# Patient Record
Sex: Female | Born: 1980 | Hispanic: Yes | Marital: Single | Smoking: Never smoker
Health system: Southern US, Community
[De-identification: ages and names within clinical notes are randomized; demographics above are authoritative.]

## PROBLEM LIST (undated history)

## (undated) DIAGNOSIS — T7840XA Allergy, unspecified, initial encounter: Secondary | ICD-10-CM

---

## 2011-07-10 ENCOUNTER — Ambulatory Visit: Payer: Self-pay | Admitting: Family Medicine

## 2011-12-07 ENCOUNTER — Inpatient Hospital Stay: Payer: Self-pay

## 2011-12-07 LAB — CBC WITH DIFFERENTIAL/PLATELET
Basophil #: 0 10*3/uL (ref 0.0–0.1)
Basophil %: 0.2 %
Eosinophil %: 0.3 %
HGB: 12.9 g/dL (ref 12.0–16.0)
Lymphocyte %: 20.8 %
MCH: 32.3 pg (ref 26.0–34.0)
MCHC: 34.5 g/dL (ref 32.0–36.0)
MCV: 94 fL (ref 80–100)
Monocyte #: 0.6 10*3/uL (ref 0.0–0.7)
Monocyte %: 5.5 %
Neutrophil %: 73.2 %
RBC: 4.01 10*6/uL (ref 3.80–5.20)
RDW: 13.4 % (ref 11.5–14.5)

## 2011-12-08 LAB — HEMATOCRIT: HCT: 37.5 % (ref 35.0–47.0)

## 2015-03-29 NOTE — H&P (Signed)
L&D Evaluation:  History:   HPI 34 yo G3P2002 with LMP of 02/20/11 & [facility] 11/27/[facility] for allergic rhinitis, fibrocystic breast disease, US done with EDD of 12/02/11. SROM at 1600 clear fluid. Pt is hurting now with UC's. No VB, decreased FM,    Presents with leaking fluid    Patient's Medical History fibrocystic breast disease, hemorrhoids, HA, vaginitis    Patient's Surgical History none    Medications Pre Natal Vitamins    Allergies NKDA    Social History none    Family History Non-Contributory   ROS:   ROS All systems were reviewed.  HEENT, CNS, GI, GU, Respiratory, CV, Renal and Musculoskeletal systems were found to be normal.   Exam:   Vital Signs stable    General no apparent distress    Mental Status clear    Chest clear    Heart normal sinus rhythm, no murmur/gallop/rubs    Abdomen gravid, non-tender    Estimated Fetal Weight Average for gestational age    Back no CVAT    Edema 1+    Reflexes 1+    Pelvic 6/100/vtx    Description clear    FHT normal rate with no decels    Ucx regular    Skin dry    Lymph no lymphadenopathy   Impression:   Impression active labor   Plan:   Plan monitor contractions and for cervical change, Admit for del   Electronic Signatures: Sharee PimpleJones, Jazziel Fitzsimmons W (CNM)  (Signed 18-Jan-13 18:01)  Authored: L&D Evaluation   Last Updated: 18-Jan-13 18:01 by Sharee PimpleJones, Eva Griffo W (CNM)

## 2016-03-20 ENCOUNTER — Other Ambulatory Visit: Payer: Self-pay | Admitting: Physician Assistant

## 2016-03-20 DIAGNOSIS — Z30431 Encounter for routine checking of intrauterine contraceptive device: Secondary | ICD-10-CM

## 2016-03-22 ENCOUNTER — Ambulatory Visit
Admission: RE | Admit: 2016-03-22 | Discharge: 2016-03-22 | Disposition: A | Discharge status: Home / Self Care | Payer: BLUE CROSS/BLUE SHIELD | Source: Ambulatory Visit | Attending: Physician Assistant | Admitting: Physician Assistant

## 2016-03-22 DIAGNOSIS — Z30431 Encounter for routine checking of intrauterine contraceptive device: Secondary | ICD-10-CM | POA: Insufficient documentation

## 2016-03-22 DIAGNOSIS — Z975 Presence of (intrauterine) contraceptive device: Secondary | ICD-10-CM | POA: Insufficient documentation

## 2016-09-28 ENCOUNTER — Ambulatory Visit
Admission: EM | Admit: 2016-09-28 | Discharge: 2016-09-28 | Disposition: A | Discharge status: Home / Self Care | Payer: BLUE CROSS/BLUE SHIELD | Attending: Family Medicine | Admitting: Family Medicine

## 2016-09-28 ENCOUNTER — Encounter: Payer: Self-pay | Admitting: Gynecology

## 2016-09-28 DIAGNOSIS — H6501 Acute serous otitis media, right ear: Secondary | ICD-10-CM | POA: Diagnosis not present

## 2016-09-28 HISTORY — DX: Allergy, unspecified, initial encounter: T78.40XA

## 2016-09-28 LAB — RAPID STREP SCREEN (MED CTR MEBANE ONLY): STREPTOCOCCUS, GROUP A SCREEN (DIRECT): NEGATIVE

## 2016-09-28 MED ORDER — AMOXICILLIN 875 MG PO TABS: 875.0000 mg | ORAL_TABLET | Freq: Two times a day (BID) | ORAL | 0 refills | Status: DC

## 2016-09-28 NOTE — ED Triage Notes (Signed)
Patient c/o upper respiratory infection and sore throat x 3 days.

## 2016-09-28 NOTE — ED Provider Notes (Signed)
[facility]    CSN: [id number] Arrival date & time: 09/28/16  1505     History   Chief Complaint Chief Complaint  Patient presents with  . URI  . Sore Throat    HPI Monica Travis is a 35 y.o. female.   The history is provided by the patient.  URI  Presenting symptoms: congestion, cough, ear pain (right) and sore throat   Presenting symptoms: no facial pain   Severity:  Moderate Onset quality:  Sudden Duration:  4 days Timing:  Constant Progression:  Worsening Chronicity:  New Relieved by:  Nothing Ineffective treatments:  OTC medications Associated symptoms: no arthralgias, no headaches, no myalgias, no neck pain, no sinus pain, no sneezing, no swollen glands and no wheezing   Risk factors: not elderly, no chronic cardiac disease, no chronic kidney disease, no chronic respiratory disease, no diabetes mellitus, no immunosuppression, no recent illness, no recent travel and no sick contacts     Past Medical History:  Diagnosis Date  . Allergy     There are no active problems to display for this patient.   History reviewed. No pertinent surgical history.  OB History    Gravida Para Term Preterm AB Living   1             SAB TAB Ectopic Multiple Live Births                   Home Medications    Prior to Admission medications   Medication Sig Start Date End Date Taking? Authorizing Provider  cetirizine (ZYRTEC) 10 MG tablet Take 10 mg by mouth daily.   Yes Historical Provider, MD  amoxicillin (AMOXIL) 875 MG tablet Take 1 tablet (875 mg total) by mouth 2 (two) times daily. 09/28/16   Payton Mccallumrlando Morris Longenecker, MD    Family History No family history on file.  Social History Social History  Substance Use Topics  . Smoking status: Never Smoker  . Smokeless tobacco: Never Used  . Alcohol use No     Allergies   Patient has no allergy information on record.   Review of Systems Review of Systems  HENT: Positive for congestion, ear pain  (right) and sore throat. Negative for sinus pain and sneezing.   Respiratory: Positive for cough. Negative for wheezing.   Musculoskeletal: Negative for arthralgias, myalgias and neck pain.  Neurological: Negative for headaches.     Physical Exam Triage Vital Signs ED Triage Vitals  Enc Vitals Group     BP 09/28/16 1641 125/80     Pulse Rate 09/28/16 1641 80     Resp 09/28/16 1641 16     Temp 09/28/16 1641 98.3 F (36.8 C)     Temp Source 09/28/16 1641 Oral     SpO2 09/28/16 1641 100 %     Weight 09/28/16 1643 146 lb (66.2 kg)     Height 09/28/16 1643 5' (1.524 m)     Head Circumference --      Peak Flow --      Pain Score 09/28/16 1646 3     Pain Loc --      Pain Edu? --      Excl. in GC? --    No data found.   Updated Vital Signs BP 125/80 (BP Location: Left Arm)   Pulse 80   Temp 98.3 F (36.8 C) (Oral)   Resp 16   Ht 5' (1.524 m)   Wt 146 lb (66.2 kg)  LMP 09/14/2016   SpO2 100%   BMI 28.51 kg/m   Visual Acuity Right Eye Distance:   Left Eye Distance:   Bilateral Distance:    Right Eye Near:   Left Eye Near:    Bilateral Near:     Physical Exam  Constitutional: She appears well-developed and well-nourished. No distress.  HENT:  Head: Normocephalic and atraumatic.  Right Ear: External ear and ear canal normal. Tympanic membrane is erythematous and bulging. A middle ear effusion is present.  Left Ear: Tympanic membrane, external ear and ear canal normal.  Nose: Mucosal edema and rhinorrhea present. No nose lacerations, sinus tenderness, nasal deformity, septal deviation or nasal septal hematoma. No epistaxis.  No foreign bodies. Right sinus exhibits no maxillary sinus tenderness and no frontal sinus tenderness. Left sinus exhibits no maxillary sinus tenderness and no frontal sinus tenderness.  Mouth/Throat: Uvula is midline, oropharynx is clear and moist and mucous membranes are normal. No oropharyngeal exudate.  Eyes: Conjunctivae and EOM are normal.  Pupils are equal, round, and reactive to light. Right eye exhibits no discharge. Left eye exhibits no discharge. No scleral icterus.  Neck: Normal range of motion. Neck supple. No thyromegaly present.  Cardiovascular: Normal rate, regular rhythm and normal heart sounds.   Pulmonary/Chest: Effort normal and breath sounds normal. No respiratory distress. She has no wheezes. She has no rales.  Lymphadenopathy:    She has no cervical adenopathy.  Skin: She is not diaphoretic.  Nursing note and vitals reviewed.    UC Treatments / Results  Labs (all labs ordered are listed, but only abnormal results are displayed) Labs Reviewed  RAPID STREP SCREEN (NOT AT [facility])  CULTURE, GROUP A STREP [facility])    EKG  EKG Interpretation None       Radiology No results found.  Procedures Procedures (including critical care time)  Medications Ordered in UC Medications - No data to display   Initial Impression / Assessment and Plan / UC Course  I have reviewed the triage vital signs and the nursing notes.  Pertinent labs & imaging results that were available during my care of the patient were reviewed by me and considered in my medical decision making (see chart for details).  Clinical Course       Final Clinical Impressions(s) / UC Diagnoses   Final diagnoses:  Right acute serous otitis media, recurrence not specified    New Prescriptions Discharge Medication List as of 09/28/2016  5:20 PM    START taking these medications   Details  amoxicillin (AMOXIL) 875 MG tablet Take 1 tablet (875 mg total) by mouth 2 (two) times daily., Starting Fri 09/28/2016, Normal       1. diagnosis reviewed with patient 2. rx as per orders above; reviewed possible side effects, interactions, risks and benefits  3. Recommend supportive treatment with increased fluids 4. Follow-up prn if symptoms worsen or don't improve   Payton Mccallumrlando Bernell Haynie, MD 09/28/16 1814

## 2016-10-01 ENCOUNTER — Telehealth: Payer: Self-pay | Admitting: *Deleted

## 2016-10-01 LAB — CULTURE, GROUP A STREP (THRC)

## 2017-01-08 ENCOUNTER — Other Ambulatory Visit: Payer: Self-pay | Admitting: Physician Assistant

## 2017-01-08 DIAGNOSIS — Z3201 Encounter for pregnancy test, result positive: Secondary | ICD-10-CM

## 2017-01-11 ENCOUNTER — Emergency Department: Payer: BLUE CROSS/BLUE SHIELD

## 2017-01-11 ENCOUNTER — Emergency Department
Admission: EM | Admit: 2017-01-11 | Discharge: 2017-01-11 | Disposition: A | Discharge status: Home / Self Care | Payer: BLUE CROSS/BLUE SHIELD | Attending: Emergency Medicine | Admitting: Emergency Medicine

## 2017-01-11 DIAGNOSIS — Z3A01 Less than 8 weeks gestation of pregnancy: Secondary | ICD-10-CM | POA: Insufficient documentation

## 2017-01-11 DIAGNOSIS — O209 Hemorrhage in early pregnancy, unspecified: Secondary | ICD-10-CM | POA: Insufficient documentation

## 2017-01-11 DIAGNOSIS — O4690 Antepartum hemorrhage, unspecified, unspecified trimester: Secondary | ICD-10-CM

## 2017-01-11 DIAGNOSIS — N898 Other specified noninflammatory disorders of vagina: Secondary | ICD-10-CM | POA: Diagnosis not present

## 2017-01-11 LAB — CBC WITH DIFFERENTIAL/PLATELET
BASOS ABS: 0.1 10*3/uL (ref 0–0.1)
BASOS PCT: 1 %
Eosinophils Absolute: 0.1 10*3/uL (ref 0–0.7)
Eosinophils Relative: 2 %
HCT: 37.4 % (ref 35.0–47.0)
HEMOGLOBIN: 13.5 g/dL (ref 12.0–16.0)
Lymphocytes Relative: 32 %
Lymphs Abs: 2.4 10*3/uL (ref 1.0–3.6)
MCH: 31.9 pg (ref 26.0–34.0)
MCHC: 36 g/dL (ref 32.0–36.0)
MCV: 88.7 fL (ref 80.0–100.0)
Monocytes Absolute: 0.4 10*3/uL (ref 0.2–0.9)
Monocytes Relative: 6 %
NEUTROS ABS: 4.4 10*3/uL (ref 1.4–6.5)
NEUTROS PCT: 59 %
Platelets: 283 10*3/uL (ref 150–440)
RBC: 4.22 MIL/uL (ref 3.80–5.20)
RDW: 12.8 % (ref 11.5–14.5)
WBC: 7.4 10*3/uL (ref 3.6–11.0)

## 2017-01-11 LAB — POCT PREGNANCY, URINE: PREG TEST UR: NEGATIVE

## 2017-01-11 LAB — ABO/RH: ABO/RH(D): A POS

## 2017-01-11 LAB — HCG, QUANTITATIVE, PREGNANCY: HCG, BETA CHAIN, QUANT, S: 77 m[IU]/mL — AB (ref <5)

## 2017-01-11 NOTE — ED Triage Notes (Signed)
Pt states that she is [redacted] weeks pregnant, pt states that she started cramping yesterday then bleeding a little last night, pt states that at work this am she started bleeding heavier like a menstral period.

## 2017-01-11 NOTE — ED Provider Notes (Signed)
Time Seen: Approximately 0839  I have reviewed the triage notes  Chief Complaint: Vaginal Bleeding   History of Present Illness: Monica Travis is a 36 y.o. female *who presents with some previous history of gravida 4 para 3 with 3 previous vaginal deliveries. She states that she is approximately [redacted] weeks pregnant and had a positive pregnancy test and was seen and evaluated briefly in the office by the OB/GYN. No ultrasound was performed, etc. Patient states she's been doing well up until last night she noticed some spotty vaginal bleeding that seemed to resolve and then she went to work today and started having heavier vaginal bleeding. She states she has not noticed any large clots. She is used to 4 pads prior to arrival. She denies any significant pain at this time.   Past Medical History:  Diagnosis Date  . Allergy     There are no active problems to display for this patient.   No past surgical history on file.  No past surgical history on file.  Current Outpatient Rx  . Order #: [id number] Class: Normal  . Order #: [id number] Class: Historical Med    Allergies:  Patient has no known allergies.  Family History: No family history on file.  Social History: Social History  Substance Use Topics  . Smoking status: Never Smoker  . Smokeless tobacco: Never Used  . Alcohol use No     Review of Systems:   10 point review of systems was performed and was otherwise negative:  Constitutional: No fever Eyes: No visual disturbances ENT: No sore throat, ear pain Cardiac: No chest pain Respiratory: No shortness of breath, wheezing, or stridor Abdomen: Abdominal pain was lower middle with cramping Endocrine: No weight loss, No night sweats Extremities: No peripheral edema, cyanosis Skin: No rashes, easy bruising Neurologic: No focal weakness, trouble with speech or swollowing Urologic: No dysuria, Hematuria, or urinary frequency   Physical Exam:  ED Triage Vitals   Enc Vitals Group     BP 01/11/17 0827 119/77     Pulse Rate 01/11/17 0827 76     Resp 01/11/17 0827 15     Temp 01/11/17 0827 98.9 F (37.2 C)     Temp Source 01/11/17 0827 Oral     SpO2 01/11/17 0827 98 %     Weight 01/11/17 0828 154 lb (69.9 kg)     Height 01/11/17 0828 5' (1.524 m)     Head Circumference --      Peak Flow --      Pain Score 01/11/17 0833 1     Pain Loc --      Pain Edu? --      Excl. in GC? --     General: Awake , Alert , and Oriented times 3; GCS 15 Head: Normal cephalic , atraumatic Eyes: Pupils equal , round, reactive to light Nose/Throat: No nasal drainage, patent upper airway without erythema or exudate.  Neck: Supple, Full range of motion, No anterior adenopathy or palpable thyroid masses Lungs: Clear to ascultation without wheezes , rhonchi, or rales Heart: Regular rate, regular rhythm without murmurs , gallops , or rubs Abdomen: Soft, non tender without rebound, guarding , or rigidity; bowel sounds positive and symmetric in all 4 quadrants. No organomegaly .        Extremities: 2 plus symmetric pulses. No edema, clubbing or cyanosis Neurologic: normal ambulation, Motor symmetric without deficits, sensory intact Skin: warm, dry, no rashes   Labs:   All laboratory work was  reviewed including any pertinent negatives or positives listed below:  Labs Reviewed  HCG, QUANTITATIVE, PREGNANCY  CBC WITH DIFFERENTIAL/PLATELET  POC URINE PREG, ED  POC URINE PREG, ED  ABO/RH  Blood type is A+ CBC is stable Quantitative pregnancy is low at 77  Radiology: * "[place] Ob Comp Less 14 Wks  Result Date: 01/11/2017 CLINICAL DATA:  36 year old female with vaginal bleeding in the first trimester of pregnancy. Initial encounter. Quantitative beta HCG 77. EXAM: OBSTETRIC <14 WK ULTRASOUND TECHNIQUE: Transabdominal ultrasound was performed for evaluation of the gestation as well as the maternal uterus and adnexal regions. COMPARISON:  Pelvis ultrasound 03/22/2016.  FINDINGS: Intrauterine gestational sac: Questionable (images 3453 and 60). Yolk sac:  None Embryo:  None Cardiac Activity: Not applicable Heart Rate: Not applicable bpm Subchorionic hemorrhage:  None Maternal uterus/adnexae: The uterus is retroflexed. Cystic area at the uterine fundus on image 53 measuring 4-5 mm diameter. But this seems to be outside of the endometrium and within the myometrium on image 60. Otherwise bland appearance of the endometrium measuring 4 mm (image 38). Normal right ovary measuring 1.2 x 2.3 x 1.4 cm. The left ovary measures 1.8 x 3.3 x 1.2 cm and contains a dominant 12 mm simple appearing cyst or follicle (image 88). There is also a more complex 12 mm hypoechoic area in the left ovary (images 91-94) but there is no surrounding hypervascularity. No pelvic free fluid. IMPRESSION: 1. Questionable early intrauterine gestational sac, but more likely a small myometrial cyst. No yolk sac, fetal pole, or cardiac activity . 2. Normal right ovary. 12 mm complex area in the left ovary but no associated hypervascularity. No pelvic free fluid. 3. Differential considerations include Ectopic Pregnancy, early IUP, and failed IUP. Recommend serial quantitative B-HCG levels and follow-up US as needed to assess viability. Electronically Signed   By: Odessa FlemingH  Hall M.D.   On: 01/11/2017 11:14   [place] Ob Transvaginal  Result Date: 01/11/2017 CLINICAL DATA:  36 year old female with vaginal bleeding in the first trimester of pregnancy. Initial encounter. Quantitative beta HCG 77. EXAM: OBSTETRIC <14 WK ULTRASOUND TECHNIQUE: Transabdominal ultrasound was performed for evaluation of the gestation as well as the maternal uterus and adnexal regions. COMPARISON:  Pelvis ultrasound 03/22/2016. FINDINGS: Intrauterine gestational sac: Questionable (images 5453 and 60). Yolk sac:  None Embryo:  None Cardiac Activity: Not applicable Heart Rate: Not applicable bpm Subchorionic hemorrhage:  None Maternal uterus/adnexae: The uterus  is retroflexed. Cystic area at the uterine fundus on image 53 measuring 4-5 mm diameter. But this seems to be outside of the endometrium and within the myometrium on image 60. Otherwise bland appearance of the endometrium measuring 4 mm (image 38). Normal right ovary measuring 1.2 x 2.3 x 1.4 cm. The left ovary measures 1.8 x 3.3 x 1.2 cm and contains a dominant 12 mm simple appearing cyst or follicle (image 88). There is also a more complex 12 mm hypoechoic area in the left ovary (images 91-94) but there is no surrounding hypervascularity. No pelvic free fluid. IMPRESSION: 1. Questionable early intrauterine gestational sac, but more likely a small myometrial cyst. No yolk sac, fetal pole, or cardiac activity . 2. Normal right ovary. 12 mm complex area in the left ovary but no associated hypervascularity. No pelvic free fluid. 3. Differential considerations include Ectopic Pregnancy, early IUP, and failed IUP. Recommend serial quantitative B-HCG levels and follow-up US as needed to assess viability. Electronically Signed   By: Odessa FlemingH  Hall M.D.   On: 01/11/2017 11:14  "  I personally reviewed the radiologic studies    ED Course:  The patient's ultrasound was equivocal at this time. Given her clinical presentation is most likely is a spontaneous abortion., Patient will require follow-up to see which direction her serum quantitative pregnancy test goes; he arises or quadruples in the next 4 days  or decreases to a 0.   Assessment:  First trimester vaginal bleeding   F   Plan: * I spoke to Dr.Staebler who is on-call for OB/GYN unassigned and we have aligned a repeat quantitative pregnancy test to be performed on Tuesday. Was advised to return here to the emergency department she develops heavy vaginal bleeding of 1 full pad per hour for 4 consecutive hours, increasing abdominal pain, fever, or any other new concerns.  Patient was advised to return immediately if condition worsens. Patient was advised  to follow up with their primary care physician or other specialized physicians involved in their outpatient care. The patient and/or family member/power of attorney had laboratory results reviewed at the bedside. All questions and concerns were addressed and appropriate discharge instructions were distributed by the nursing staff.             Jennye Moccasin, MD 01/11/17 [phone]

## 2017-01-11 NOTE — Discharge Instructions (Signed)
Please call the doctor's office either later today or Monday to schedule an appointment to have your blood redrawn for a serum quantitative pregnancy. Please let the office staff know that this is an emergency department follow-up and we spoke to Dr. Bonney AidStaebler who knows about your visit to the emergency department.  Please drink plenty of fluids and return to the emergency department if you have heavy vaginal bleeding of more than 1 full pad per hour for 4 consecutive hours, increasing abdominal pain, or any other new concerns.

## 2017-01-21 ENCOUNTER — Ambulatory Visit: Admission: RE | Admit: 2017-01-21 | Payer: BLUE CROSS/BLUE SHIELD | Source: Ambulatory Visit

## 2017-05-15 LAB — OB RESULTS CONSOLE GC/CHLAMYDIA
Chlamydia: NEGATIVE
Gonorrhea: NEGATIVE

## 2017-05-15 LAB — OB RESULTS CONSOLE HIV ANTIBODY (ROUTINE TESTING): HIV: NONREACTIVE

## 2017-05-15 LAB — OB RESULTS CONSOLE RPR: RPR: NONREACTIVE

## 2017-05-15 LAB — OB RESULTS CONSOLE HEPATITIS B SURFACE ANTIGEN: HEP B S AG: NEGATIVE

## 2017-05-15 LAB — OB RESULTS CONSOLE VARICELLA ZOSTER ANTIBODY, IGG: Varicella: IMMUNE

## 2017-06-20 ENCOUNTER — Other Ambulatory Visit: Payer: Self-pay | Admitting: Primary Care

## 2017-06-20 DIAGNOSIS — Z3481 Encounter for supervision of other normal pregnancy, first trimester: Secondary | ICD-10-CM

## 2017-06-25 ENCOUNTER — Ambulatory Visit
Admission: RE | Admit: 2017-06-25 | Discharge: 2017-06-25 | Disposition: A | Discharge status: Home / Self Care | Payer: BLUE CROSS/BLUE SHIELD | Source: Ambulatory Visit | Attending: Primary Care | Admitting: Primary Care

## 2017-06-25 DIAGNOSIS — Z3A17 17 weeks gestation of pregnancy: Secondary | ICD-10-CM | POA: Insufficient documentation

## 2017-06-25 DIAGNOSIS — Z3689 Encounter for other specified antenatal screening: Secondary | ICD-10-CM | POA: Insufficient documentation

## 2017-06-25 DIAGNOSIS — Z3481 Encounter for supervision of other normal pregnancy, first trimester: Secondary | ICD-10-CM

## 2017-06-25 DIAGNOSIS — O09522 Supervision of elderly multigravida, second trimester: Secondary | ICD-10-CM | POA: Insufficient documentation

## 2017-10-25 LAB — OB RESULTS CONSOLE GBS: GBS: POSITIVE

## 2017-11-21 ENCOUNTER — Other Ambulatory Visit: Payer: Self-pay | Admitting: Primary Care

## 2017-11-21 ENCOUNTER — Other Ambulatory Visit: Payer: Self-pay | Admitting: Obstetrics and Gynecology

## 2017-11-21 DIAGNOSIS — Z3483 Encounter for supervision of other normal pregnancy, third trimester: Secondary | ICD-10-CM

## 2017-11-21 DIAGNOSIS — O24419 Gestational diabetes mellitus in pregnancy, unspecified control: Secondary | ICD-10-CM

## 2017-11-22 ENCOUNTER — Inpatient Hospital Stay: Payer: BLUE CROSS/BLUE SHIELD | Admitting: Anesthesiology

## 2017-11-22 ENCOUNTER — Other Ambulatory Visit: Payer: Self-pay

## 2017-11-22 ENCOUNTER — Inpatient Hospital Stay
Admission: EM | Admit: 2017-11-22 | Discharge: 2017-11-25 | DRG: 787 | Disposition: A | Discharge status: Home / Self Care | Payer: BLUE CROSS/BLUE SHIELD | Attending: Obstetrics and Gynecology | Admitting: Obstetrics and Gynecology

## 2017-11-22 ENCOUNTER — Encounter
Admission: EM | Disposition: A | Discharge status: Home / Self Care | Payer: Self-pay | Source: Home / Self Care | Attending: Obstetrics and Gynecology

## 2017-11-22 ENCOUNTER — Inpatient Hospital Stay: Payer: BLUE CROSS/BLUE SHIELD

## 2017-11-22 DIAGNOSIS — D62 Acute posthemorrhagic anemia: Secondary | ICD-10-CM | POA: Diagnosis not present

## 2017-11-22 DIAGNOSIS — O1414 Severe pre-eclampsia complicating childbirth: Secondary | ICD-10-CM | POA: Diagnosis present

## 2017-11-22 DIAGNOSIS — O9081 Anemia of the puerperium: Secondary | ICD-10-CM | POA: Diagnosis not present

## 2017-11-22 DIAGNOSIS — Z3A39 39 weeks gestation of pregnancy: Secondary | ICD-10-CM

## 2017-11-22 DIAGNOSIS — O99824 Streptococcus B carrier state complicating childbirth: Secondary | ICD-10-CM | POA: Diagnosis present

## 2017-11-22 DIAGNOSIS — O24425 Gestational diabetes mellitus in childbirth, controlled by oral hypoglycemic drugs: Principal | ICD-10-CM | POA: Diagnosis present

## 2017-11-22 DIAGNOSIS — Z419 Encounter for procedure for purposes other than remedying health state, unspecified: Secondary | ICD-10-CM

## 2017-11-22 DIAGNOSIS — O24419 Gestational diabetes mellitus in pregnancy, unspecified control: Secondary | ICD-10-CM

## 2017-11-22 LAB — GLUCOSE, CAPILLARY
GLUCOSE-CAPILLARY: 107 mg/dL — AB (ref 65–99)
Glucose-Capillary: 116 mg/dL — ABNORMAL HIGH (ref 65–99)
Glucose-Capillary: 129 mg/dL — ABNORMAL HIGH (ref 65–99)
Glucose-Capillary: 102 mg/dL — ABNORMAL HIGH (ref 65–99)

## 2017-11-22 LAB — COMPREHENSIVE METABOLIC PANEL
ALK PHOS: 129 U/L — AB (ref 38–126)
ALK PHOS: 137 U/L — AB (ref 38–126)
ALT: 16 U/L (ref 14–54)
ALT: 15 U/L (ref 14–54)
AST: 22 U/L (ref 15–41)
AST: 26 U/L (ref 15–41)
Albumin: 3.1 g/dL — ABNORMAL LOW (ref 3.5–5.0)
Albumin: 3.1 g/dL — ABNORMAL LOW (ref 3.5–5.0)
Anion gap: 10 (ref 5–15)
Anion gap: 11 (ref 5–15)
BILIRUBIN TOTAL: 0.4 mg/dL (ref 0.3–1.2)
BUN: 8 mg/dL (ref 6–20)
BUN: 6 mg/dL (ref 6–20)
CALCIUM: 9.1 mg/dL (ref 8.9–10.3)
CALCIUM: 8.8 mg/dL — AB (ref 8.9–10.3)
CHLORIDE: 106 mmol/L (ref 101–111)
CO2: 21 mmol/L — AB (ref 22–32)
CO2: 21 mmol/L — ABNORMAL LOW (ref 22–32)
CREATININE: 0.43 mg/dL — AB (ref 0.44–1.00)
Chloride: 105 mmol/L (ref 101–111)
Creatinine, Ser: 0.44 mg/dL (ref 0.44–1.00)
Glucose, Bld: 164 mg/dL — ABNORMAL HIGH (ref 65–99)
Glucose, Bld: 125 mg/dL — ABNORMAL HIGH (ref 65–99)
Potassium: 3.5 mmol/L (ref 3.5–5.1)
Potassium: 3.9 mmol/L (ref 3.5–5.1)
SODIUM: 136 mmol/L (ref 135–145)
Sodium: 138 mmol/L (ref 135–145)
TOTAL PROTEIN: 6.8 g/dL (ref 6.5–8.1)
Total Bilirubin: 0.4 mg/dL (ref 0.3–1.2)
Total Protein: 6.5 g/dL (ref 6.5–8.1)

## 2017-11-22 LAB — CBC WITH DIFFERENTIAL/PLATELET
Basophils Absolute: 0 10*3/uL (ref 0–0.1)
Basophils Relative: 0 %
EOS PCT: 0 %
Eosinophils Absolute: 0 10*3/uL (ref 0–0.7)
HEMATOCRIT: 38.4 % (ref 35.0–47.0)
Hemoglobin: 13.3 g/dL (ref 12.0–16.0)
LYMPHS ABS: 1.5 10*3/uL (ref 1.0–3.6)
LYMPHS PCT: 13 %
MCH: 32.1 pg (ref 26.0–34.0)
MCHC: 34.6 g/dL (ref 32.0–36.0)
MCV: 92.8 fL (ref 80.0–100.0)
Monocytes Absolute: 0.6 10*3/uL (ref 0.2–0.9)
Monocytes Relative: 5 %
Neutro Abs: 9.8 10*3/uL — ABNORMAL HIGH (ref 1.4–6.5)
Neutrophils Relative %: 82 %
PLATELETS: 283 10*3/uL (ref 150–440)
RBC: 4.14 MIL/uL (ref 3.80–5.20)
RDW: 14.3 % (ref 11.5–14.5)
WBC: 12 10*3/uL — AB (ref 3.6–11.0)

## 2017-11-22 LAB — CBC
HCT: 36.7 % (ref 35.0–47.0)
HEMOGLOBIN: 12.7 g/dL (ref 12.0–16.0)
MCH: 31.7 pg (ref 26.0–34.0)
MCHC: 34.6 g/dL (ref 32.0–36.0)
MCV: 91.7 fL (ref 80.0–100.0)
PLATELETS: 276 10*3/uL (ref 150–440)
RBC: 4 MIL/uL (ref 3.80–5.20)
RDW: 14.2 % (ref 11.5–14.5)
WBC: 9.6 10*3/uL (ref 3.6–11.0)

## 2017-11-22 LAB — PROTEIN / CREATININE RATIO, URINE
CREATININE, URINE: 96 mg/dL
Protein Creatinine Ratio: 0.55 mg/mg{Cre} — ABNORMAL HIGH (ref 0.00–0.15)
Total Protein, Urine: 53 mg/dL

## 2017-11-22 LAB — TYPE AND SCREEN
ABO/RH(D): A POS
Antibody Screen: NEGATIVE

## 2017-11-22 SURGERY — Surgical Case
Anesthesia: Epidural | Wound class: Clean Contaminated

## 2017-11-22 MED ORDER — BUPIVACAINE HCL (PF) 0.25 % IJ SOLN
INTRAMUSCULAR | Status: DC | PRN
  Administered 2017-11-22: 4 mL via EPIDURAL
  Administered 2017-11-22: 3 mL via EPIDURAL

## 2017-11-22 MED ORDER — MAGNESIUM SULFATE 50 % IJ SOLN: 2.0000 g/h | INTRAVENOUS | Status: DC

## 2017-11-22 MED ORDER — MORPHINE SULFATE (PF) 0.5 MG/ML IJ SOLN
INTRAMUSCULAR | Status: AC
  Filled 2017-11-22: qty 10

## 2017-11-22 MED ORDER — DIPHENHYDRAMINE HCL 25 MG PO CAPS: 25.0000 mg | ORAL_CAPSULE | Freq: Four times a day (QID) | ORAL | Status: DC | PRN

## 2017-11-22 MED ORDER — DIPHENHYDRAMINE HCL 50 MG/ML IJ SOLN
12.5000 mg | INTRAMUSCULAR | Status: DC | PRN
  Administered 2017-11-22: 12.5 mg via INTRAVENOUS
  Filled 2017-11-22: qty 1

## 2017-11-22 MED ORDER — SIMETHICONE 80 MG PO CHEW
160.0000 mg | CHEWABLE_TABLET | Freq: Four times a day (QID) | ORAL | Status: DC | PRN
  Administered 2017-11-23: 160 mg via ORAL
  Filled 2017-11-22: qty 2

## 2017-11-22 MED ORDER — OXYTOCIN BOLUS FROM INFUSION: 500.0000 mL | Freq: Once | INTRAVENOUS | Status: DC

## 2017-11-22 MED ORDER — CHLOROPROCAINE HCL (PF) 3 % IJ SOLN
INTRAMUSCULAR | Status: AC
  Filled 2017-11-22: qty 40

## 2017-11-22 MED ORDER — OXYCODONE HCL 5 MG PO TABS
5.0000 mg | ORAL_TABLET | ORAL | Status: DC | PRN
Start: 2017-11-22 — End: 2017-11-22

## 2017-11-22 MED ORDER — EPHEDRINE 5 MG/ML INJ
10.0000 mg | INTRAVENOUS | Status: DC | PRN
Start: 2017-11-22 — End: 2017-11-22

## 2017-11-22 MED ORDER — ACETAMINOPHEN 500 MG PO TABS
1000.0000 mg | ORAL_TABLET | Freq: Four times a day (QID) | ORAL | Status: DC
  Administered 2017-11-23 – 2017-11-25 (×9): 1000 mg via ORAL
  Filled 2017-11-22 (×10): qty 2

## 2017-11-22 MED ORDER — EPHEDRINE SULFATE 50 MG/ML IJ SOLN
INTRAMUSCULAR | Status: DC | PRN
  Administered 2017-11-22 (×2): 10 mg via INTRAVENOUS

## 2017-11-22 MED ORDER — DEXTROSE 5 % IV SOLN
2.0000 g | INTRAVENOUS | Status: AC
  Administered 2017-11-22: 2 g via INTRAVENOUS
  Filled 2017-11-22: qty 2000

## 2017-11-22 MED ORDER — ACETAMINOPHEN 325 MG PO TABS: 650.0000 mg | ORAL_TABLET | ORAL | Status: DC | PRN

## 2017-11-22 MED ORDER — LABETALOL HCL 5 MG/ML IV SOLN
20.0000 mg | INTRAVENOUS | Status: DC | PRN
  Filled 2017-11-22: qty 16

## 2017-11-22 MED ORDER — NALBUPHINE HCL 10 MG/ML IJ SOLN: 5.0000 mg | Freq: Once | INTRAMUSCULAR | Status: DC | PRN

## 2017-11-22 MED ORDER — LIDOCAINE HCL (PF) 1 % IJ SOLN
INTRAMUSCULAR | Status: AC
  Filled 2017-11-22: qty 30

## 2017-11-22 MED ORDER — LABETALOL HCL 5 MG/ML IV SOLN
20.0000 mg | INTRAVENOUS | Status: DC | PRN
  Administered 2017-11-22: 20 mg via INTRAVENOUS

## 2017-11-22 MED ORDER — NALBUPHINE HCL 10 MG/ML IJ SOLN: 5.0000 mg | INTRAMUSCULAR | Status: DC | PRN

## 2017-11-22 MED ORDER — OXYTOCIN 40 UNITS IN LACTATED RINGERS INFUSION - SIMPLE MED
INTRAVENOUS | Status: DC | PRN
  Administered 2017-11-22: 750 mL via INTRAVENOUS

## 2017-11-22 MED ORDER — ACETAMINOPHEN 325 MG PO TABS: 650.0000 mg | ORAL_TABLET | Freq: Four times a day (QID) | ORAL | Status: AC

## 2017-11-22 MED ORDER — TERBUTALINE SULFATE 1 MG/ML IJ SOLN: 0.2500 mg | Freq: Once | INTRAMUSCULAR | Status: DC | PRN

## 2017-11-22 MED ORDER — LABETALOL HCL 5 MG/ML IV SOLN
INTRAVENOUS | Status: AC
  Filled 2017-11-22: qty 4

## 2017-11-22 MED ORDER — MORPHINE SULFATE (PF) 0.5 MG/ML IJ SOLN
INTRAMUSCULAR | Status: DC | PRN
  Administered 2017-11-22: 2.5 mg via EPIDURAL

## 2017-11-22 MED ORDER — SODIUM CHLORIDE FLUSH 0.9 % IV SOLN
INTRAVENOUS | Status: AC
  Filled 2017-11-22: qty 10

## 2017-11-22 MED ORDER — SODIUM CHLORIDE 0.9% FLUSH
3.0000 mL | Freq: Two times a day (BID) | INTRAVENOUS | Status: DC
  Administered 2017-11-23 – 2017-11-24 (×2): 3 mL via INTRAVENOUS

## 2017-11-22 MED ORDER — DEXTROSE IN LACTATED RINGERS 5 % IV SOLN: INTRAVENOUS | Status: DC

## 2017-11-22 MED ORDER — DEXAMETHASONE SODIUM PHOSPHATE 10 MG/ML IJ SOLN
INTRAMUSCULAR | Status: AC
  Filled 2017-11-22: qty 1

## 2017-11-22 MED ORDER — BUPIVACAINE HCL (PF) 0.5 % IJ SOLN
INTRAMUSCULAR | Status: AC
  Filled 2017-11-22: qty 30

## 2017-11-22 MED ORDER — LACTATED RINGERS IV SOLN: 500.0000 mL | Freq: Once | INTRAVENOUS | Status: DC

## 2017-11-22 MED ORDER — ONDANSETRON HCL 4 MG/2ML IJ SOLN
INTRAMUSCULAR | Status: DC | PRN
  Administered 2017-11-22: 4 mg via INTRAVENOUS

## 2017-11-22 MED ORDER — LIDOCAINE HCL (PF) 1 % IJ SOLN
30.0000 mL | INTRAMUSCULAR | Status: AC | PRN
  Administered 2017-11-22: 3 mL via SUBCUTANEOUS

## 2017-11-22 MED ORDER — DIPHENHYDRAMINE HCL 50 MG/ML IJ SOLN: 12.5000 mg | INTRAMUSCULAR | Status: DC | PRN

## 2017-11-22 MED ORDER — LACTATED RINGERS IV SOLN
INTRAVENOUS | Status: DC
  Administered 2017-11-22 – 2017-11-23 (×3): via INTRAVENOUS

## 2017-11-22 MED ORDER — COCONUT OIL OIL: 1.0000 "application " | TOPICAL_OIL | Status: DC | PRN

## 2017-11-22 MED ORDER — LIDOCAINE 2% (20 MG/ML) 5 ML SYRINGE
INTRAMUSCULAR | Status: DC | PRN
  Administered 2017-11-22 (×2): 100 mg via INTRAVENOUS

## 2017-11-22 MED ORDER — OXYTOCIN 40 UNITS IN LACTATED RINGERS INFUSION - SIMPLE MED
1.0000 m[IU]/min | INTRAVENOUS | Status: DC
  Administered 2017-11-22: 1 m[IU]/min via INTRAVENOUS
  Filled 2017-11-22 (×2): qty 1000

## 2017-11-22 MED ORDER — METHYLERGONOVINE MALEATE 0.2 MG/ML IJ SOLN
INTRAMUSCULAR | Status: AC
  Filled 2017-11-22: qty 1

## 2017-11-22 MED ORDER — LACTATED RINGERS IV SOLN: INTRAVENOUS | Status: DC

## 2017-11-22 MED ORDER — PRENATAL MULTIVITAMIN CH
1.0000 | ORAL_TABLET | Freq: Every day | ORAL | Status: DC
  Administered 2017-11-23 – 2017-11-25 (×3): 1 via ORAL
  Filled 2017-11-22 (×3): qty 1

## 2017-11-22 MED ORDER — CARBOPROST TROMETHAMINE 250 MCG/ML IM SOLN
INTRAMUSCULAR | Status: AC
  Filled 2017-11-22: qty 1

## 2017-11-22 MED ORDER — SOD CITRATE-CITRIC ACID 500-334 MG/5ML PO SOLN: 30.0000 mL | ORAL | Status: DC

## 2017-11-22 MED ORDER — NALOXONE HCL 0.4 MG/ML IJ SOLN: 0.4000 mg | INTRAMUSCULAR | Status: DC | PRN

## 2017-11-22 MED ORDER — SODIUM CHLORIDE 0.9% FLUSH: 3.0000 mL | INTRAVENOUS | Status: DC | PRN

## 2017-11-22 MED ORDER — HYDRALAZINE HCL 20 MG/ML IJ SOLN: 10.0000 mg | Freq: Once | INTRAMUSCULAR | Status: DC | PRN

## 2017-11-22 MED ORDER — LACTATED RINGERS IV SOLN
INTRAVENOUS | Status: DC
  Administered 2017-11-22 (×2): via INTRAVENOUS

## 2017-11-22 MED ORDER — KETOROLAC TROMETHAMINE 30 MG/ML IJ SOLN: 30.0000 mg | Freq: Four times a day (QID) | INTRAMUSCULAR | Status: AC | PRN

## 2017-11-22 MED ORDER — FENTANYL CITRATE (PF) 100 MCG/2ML IJ SOLN
25.0000 ug | INTRAMUSCULAR | Status: DC | PRN
  Administered 2017-11-22: 50 ug via INTRAVENOUS
  Filled 2017-11-22: qty 2

## 2017-11-22 MED ORDER — PHENYLEPHRINE 40 MCG/ML (10ML) SYRINGE FOR IV PUSH (FOR BLOOD PRESSURE SUPPORT): 80.0000 ug | PREFILLED_SYRINGE | INTRAVENOUS | Status: DC | PRN

## 2017-11-22 MED ORDER — OXYCODONE HCL 5 MG/5ML PO SOLN
5.0000 mg | Freq: Once | ORAL | Status: DC | PRN
  Filled 2017-11-22: qty 5

## 2017-11-22 MED ORDER — OXYCODONE HCL 5 MG PO TABS
5.0000 mg | ORAL_TABLET | ORAL | Status: DC | PRN
  Administered 2017-11-23 – 2017-11-25 (×4): 5 mg via ORAL
  Filled 2017-11-22 (×4): qty 1

## 2017-11-22 MED ORDER — OXYTOCIN 40 UNITS IN LACTATED RINGERS INFUSION - SIMPLE MED
2.5000 [IU]/h | INTRAVENOUS | Status: DC
Start: 2017-11-22 — End: 2017-11-22

## 2017-11-22 MED ORDER — ONDANSETRON HCL 4 MG/2ML IJ SOLN: 4.0000 mg | Freq: Four times a day (QID) | INTRAMUSCULAR | Status: DC | PRN

## 2017-11-22 MED ORDER — BUPIVACAINE LIPOSOME 1.3 % IJ SUSP
INTRAMUSCULAR | Status: DC | PRN
  Administered 2017-11-22 (×2): 50 mL

## 2017-11-22 MED ORDER — MENTHOL 3 MG MT LOZG
1.0000 | LOZENGE | OROMUCOSAL | Status: DC | PRN
  Filled 2017-11-22: qty 9

## 2017-11-22 MED ORDER — DIPHENHYDRAMINE HCL 25 MG PO CAPS: 25.0000 mg | ORAL_CAPSULE | ORAL | Status: DC | PRN

## 2017-11-22 MED ORDER — EPHEDRINE 5 MG/ML INJ: 10.0000 mg | INTRAVENOUS | Status: DC | PRN

## 2017-11-22 MED ORDER — PHENYLEPHRINE HCL 10 MG/ML IJ SOLN
INTRAMUSCULAR | Status: DC | PRN
  Administered 2017-11-22 (×5): 100 ug via INTRAVENOUS

## 2017-11-22 MED ORDER — OXYCODONE HCL 5 MG PO TABS: 5.0000 mg | ORAL_TABLET | Freq: Once | ORAL | Status: DC | PRN

## 2017-11-22 MED ORDER — OXYTOCIN 10 UNIT/ML IJ SOLN
INTRAMUSCULAR | Status: AC
  Filled 2017-11-22: qty 2

## 2017-11-22 MED ORDER — SENNOSIDES-DOCUSATE SODIUM 8.6-50 MG PO TABS
2.0000 | ORAL_TABLET | ORAL | Status: DC
  Administered 2017-11-23 – 2017-11-25 (×3): 2 via ORAL
  Filled 2017-11-22 (×4): qty 2

## 2017-11-22 MED ORDER — SODIUM CHLORIDE 0.9 % IJ SOLN
INTRAMUSCULAR | Status: AC
  Filled 2017-11-22: qty 50

## 2017-11-22 MED ORDER — SODIUM CHLORIDE 0.9 % IV SOLN: 250.0000 mL | INTRAVENOUS | Status: DC

## 2017-11-22 MED ORDER — ONDANSETRON HCL 4 MG/2ML IJ SOLN: 4.0000 mg | Freq: Three times a day (TID) | INTRAMUSCULAR | Status: DC | PRN

## 2017-11-22 MED ORDER — PENICILLIN G POT IN DEXTROSE 60000 UNIT/ML IV SOLN
3.0000 10*6.[IU] | INTRAVENOUS | Status: DC
  Administered 2017-11-22: 3 10*6.[IU] via INTRAVENOUS
  Filled 2017-11-22 (×6): qty 50

## 2017-11-22 MED ORDER — METHYLERGONOVINE MALEATE 0.2 MG/ML IJ SOLN
INTRAMUSCULAR | Status: DC | PRN
  Administered 2017-11-22: 0.2 mg via INTRAMUSCULAR

## 2017-11-22 MED ORDER — FENTANYL 2.5 MCG/ML W/ROPIVACAINE 0.15% IN NS 100 ML EPIDURAL (ARMC)
EPIDURAL | Status: AC
  Filled 2017-11-22: qty 100

## 2017-11-22 MED ORDER — OXYCODONE HCL 5 MG PO TABS
10.0000 mg | ORAL_TABLET | ORAL | Status: DC | PRN
  Administered 2017-11-23: 10 mg via ORAL
  Filled 2017-11-22: qty 2

## 2017-11-22 MED ORDER — PENICILLIN G POTASSIUM 5000000 UNITS IJ SOLR
5.0000 10*6.[IU] | Freq: Once | INTRAVENOUS | Status: AC
  Administered 2017-11-22: 5 10*6.[IU] via INTRAVENOUS
  Filled 2017-11-22: qty 5

## 2017-11-22 MED ORDER — DEXTROSE 5 % IV SOLN
500.0000 mg | INTRAVENOUS | Status: AC
  Administered 2017-11-22: 250 mg via INTRAVENOUS
  Filled 2017-11-22: qty 500

## 2017-11-22 MED ORDER — CHLOROPROCAINE HCL 1 % IJ SOLN: INTRAMUSCULAR | Status: DC | PRN

## 2017-11-22 MED ORDER — MAGNESIUM SULFATE 50 % IJ SOLN
2.0000 g/h | INTRAVENOUS | Status: DC
  Administered 2017-11-23: 2 g/h via INTRAVENOUS
  Filled 2017-11-22: qty 80

## 2017-11-22 MED ORDER — LACTATED RINGERS IV SOLN: 500.0000 mL | INTRAVENOUS | Status: DC | PRN

## 2017-11-22 MED ORDER — AMMONIA AROMATIC IN INHA
RESPIRATORY_TRACT | Status: AC
  Filled 2017-11-22: qty 10

## 2017-11-22 MED ORDER — BUPIVACAINE LIPOSOME 1.3 % IJ SUSP
INTRAMUSCULAR | Status: AC
  Filled 2017-11-22: qty 20

## 2017-11-22 MED ORDER — CALCIUM GLUCONATE 10 % IV SOLN
INTRAVENOUS | Status: AC
  Filled 2017-11-22: qty 10

## 2017-11-22 MED ORDER — KETOROLAC TROMETHAMINE 30 MG/ML IJ SOLN
30.0000 mg | Freq: Four times a day (QID) | INTRAMUSCULAR | Status: AC | PRN
  Administered 2017-11-23 (×2): 30 mg via INTRAVENOUS
  Filled 2017-11-22 (×2): qty 1

## 2017-11-22 MED ORDER — DIBUCAINE 1 % RE OINT: 1.0000 "application " | TOPICAL_OINTMENT | RECTAL | Status: DC | PRN

## 2017-11-22 MED ORDER — FENTANYL 2.5 MCG/ML W/ROPIVACAINE 0.15% IN NS 100 ML EPIDURAL (ARMC)
12.0000 mL/h | EPIDURAL | Status: DC
  Administered 2017-11-22: 12 mL/h via EPIDURAL

## 2017-11-22 MED ORDER — MISOPROSTOL 200 MCG PO TABS
ORAL_TABLET | ORAL | Status: AC
  Filled 2017-11-22: qty 4

## 2017-11-22 MED ORDER — MEPERIDINE HCL 25 MG/ML IJ SOLN: 6.2500 mg | INTRAMUSCULAR | Status: DC | PRN

## 2017-11-22 MED ORDER — OXYTOCIN 40 UNITS IN LACTATED RINGERS INFUSION - SIMPLE MED: 2.5000 [IU]/h | INTRAVENOUS | Status: AC

## 2017-11-22 MED ORDER — BUTORPHANOL TARTRATE 1 MG/ML IJ SOLN
1.0000 mg | INTRAMUSCULAR | Status: DC | PRN
  Administered 2017-11-22: 1 mg via INTRAVENOUS
  Filled 2017-11-22: qty 1

## 2017-11-22 MED ORDER — MAGNESIUM SULFATE BOLUS VIA INFUSION
4.0000 g | Freq: Once | INTRAVENOUS | Status: AC
  Administered 2017-11-22: 4 g via INTRAVENOUS

## 2017-11-22 MED ORDER — LIDOCAINE HCL (PF) 2 % IJ SOLN
INTRAMUSCULAR | Status: AC
  Filled 2017-11-22: qty 10

## 2017-11-22 MED ORDER — WITCH HAZEL-GLYCERIN EX PADS: 1.0000 "application " | MEDICATED_PAD | CUTANEOUS | Status: DC | PRN

## 2017-11-22 MED ORDER — LIDOCAINE HCL (PF) 2 % IJ SOLN
INTRAMUSCULAR | Status: DC | PRN
  Administered 2017-11-22: 3 mL via EPIDURAL

## 2017-11-22 MED ORDER — ONDANSETRON HCL 4 MG/2ML IJ SOLN
INTRAMUSCULAR | Status: AC
  Filled 2017-11-22: qty 2

## 2017-11-22 MED ORDER — LACTATED RINGERS IV SOLN
INTRAVENOUS | Status: AC
  Filled 2017-11-22: qty 80

## 2017-11-22 MED ORDER — IBUPROFEN 600 MG PO TABS
600.0000 mg | ORAL_TABLET | Freq: Four times a day (QID) | ORAL | Status: DC
  Administered 2017-11-22 – 2017-11-23 (×2): 600 mg via ORAL
  Filled 2017-11-22 (×3): qty 1

## 2017-11-22 MED ORDER — CHLOROPROCAINE HCL (PF) 3 % IJ SOLN
INTRAMUSCULAR | Status: DC | PRN
  Administered 2017-11-22 (×3): 10 mL

## 2017-11-22 MED ORDER — DEXAMETHASONE SODIUM PHOSPHATE 10 MG/ML IJ SOLN
INTRAMUSCULAR | Status: DC | PRN
  Administered 2017-11-22: 10 mg via INTRAVENOUS

## 2017-11-22 MED ORDER — SOD CITRATE-CITRIC ACID 500-334 MG/5ML PO SOLN: 30.0000 mL | ORAL | Status: DC | PRN

## 2017-11-22 MED ORDER — MISOPROSTOL 25 MCG QUARTER TABLET
25.0000 ug | ORAL_TABLET | ORAL | Status: DC | PRN
  Filled 2017-11-22: qty 1

## 2017-11-22 SURGICAL SUPPLY — 23 items
BARRIER ADHS 3X4 INTERCEED (GAUZE/BANDAGES/DRESSINGS) ×3 IMPLANT
CANISTER SUCT 3000ML PPV (MISCELLANEOUS) ×3 IMPLANT
CHLORAPREP W/TINT 26ML (MISCELLANEOUS) ×3 IMPLANT
DRSG TELFA 3X8 NADH (GAUZE/BANDAGES/DRESSINGS) ×3 IMPLANT
ELECT CAUTERY BLADE 6.4 (BLADE) ×3 IMPLANT
ELECT REM PT RETURN 9FT ADLT (ELECTROSURGICAL) ×3
ELECTRODE REM PT RTRN 9FT ADLT (ELECTROSURGICAL) ×1 IMPLANT
GAUZE SPONGE 4X4 12PLY STRL (GAUZE/BANDAGES/DRESSINGS) ×3 IMPLANT
GLOVE BIO SURGEON STRL SZ8 (GLOVE) ×3 IMPLANT
GOWN STRL REUS W/ TWL LRG LVL3 (GOWN DISPOSABLE) ×2 IMPLANT
GOWN STRL REUS W/ TWL XL LVL3 (GOWN DISPOSABLE) ×1 IMPLANT
GOWN STRL REUS W/TWL LRG LVL3 (GOWN DISPOSABLE) ×4
GOWN STRL REUS W/TWL XL LVL3 (GOWN DISPOSABLE) ×2
NEEDLE HYPO 22GX1.5 SAFETY (NEEDLE) ×3 IMPLANT
NS IRRIG 1000ML POUR BTL (IV SOLUTION) ×3 IMPLANT
PACK C SECTION AR (MISCELLANEOUS) ×3 IMPLANT
PAD OB MATERNITY 4.3X12.25 (PERSONAL CARE ITEMS) ×3 IMPLANT
PAD PREP 24X41 OB/GYN DISP (PERSONAL CARE ITEMS) ×3 IMPLANT
STRAP SAFETY BODY (MISCELLANEOUS) ×3 IMPLANT
SUT CHROMIC 1 CTX 36 (SUTURE) ×9 IMPLANT
SUT PLAIN GUT 0 (SUTURE) ×6 IMPLANT
SUT VIC AB 0 CT1 36 (SUTURE) ×6 IMPLANT
SYR 30ML LL (SYRINGE) ×6 IMPLANT

## 2017-11-22 NOTE — Anesthesia Post-op Follow-up Note (Signed)
Anesthesia QCDR form completed.        

## 2017-11-22 NOTE — Progress Notes (Addendum)
L&D Note  11/22/2017 - 7:58 AM  36 y.o. [id number]. Pregnancy complicated by GDMA2, AMA.   Patient Active Problem List   Diagnosis Date Noted  . GDM, class A2 11/22/2017    Mon[facility] Travis [facility]   Subjective:  comfortable after epidural, denies HA, VD or RUQ pain.   Objective:   Vitals:   11/22/17 0730 11/22/17 0735 11/22/17 0740 11/22/17 0745  BP:  125/74    Pulse:  82    Resp:      Temp:      TempSrc:      SpO2: 98% 97% 100% 100%  Weight:      Height:        Current Vital Signs 24h Vital Sign Ranges  T 98.3 F (36.8 C) Temp  Avg: 98.3 F (36.8 C)  Min: 98.3 F (36.8 C)  Max: 98.3 F (36.8 C)  BP 125/74 BP  Min: 125/74  Max: 172/92  HR 82 Pulse  Avg: 83  Min: 71  Max: 98  RR 18 Resp  Avg: 18  Min: 18  Max: 18  SaO2 100 % Not Delivered SpO2  Avg: 97.9 %  Min: 96 %  Max: 100 %       24 Hour I/O Current Shift I/O  Time Ins Outs 01/03 0701 - 01/04 0700 In: 250  Out: -  No intake/output data recorded.   BPs now severe range  FHR: Cat II tracing, s/p epidural with variable and early decels with UCs. Moderate variability. No accels.  FSE placed d/t deep variables.   IUPC: placed and tracing, MVUs inadequate, amnioinfusion running now.   Gen: NAD, shaky, feeling cold. DTRs 2+  SVE: rapid change from 4cm to 8cm at last check. 8/90/0   Labs:  Recent Labs  Lab 11/22/17 0102  WBC 9.6  HGB 12.7  HCT 36.7  PLT 276   Recent Labs  Lab 11/22/17 0102  NA 136  K 3.5  CL 105  CO2 21*  BUN 8  CREATININE 0.44  CALCIUM 9.1  PROT 6.5  BILITOT 0.4  ALKPHOS 129*  ALT 16  AST 22  GLUCOSE 164*   P/C ratio pending-   Medications Current Facility-Administered Medications  Medication Dose Route Frequency Provider Last Rate Last Dose  . labetalol (NORMODYNE,TRANDATE) 5 MG/ML injection           . acetaminophen (TYLENOL) tablet 650 mg  650 mg Oral Q4H PRN McVey, Prudencio Pairebecca A, CNM      . butorphanol (STADOL) injection 1 mg  1  mg Intravenous Q1H PRN McVey, Rebecca A, CNM   1 mg at 11/22/17 0511  . dextrose 5 % in lactated ringers infusion   Intravenous Continuous McVey, Prudencio Pairebecca A, CNM      . diphenhydrAMINE (BENADRYL) injection 12.5 mg  12.5 mg Intravenous Q15 min PRN Yevette EdwardsAdams, James G, MD      . ePHEDrine injection 10 mg  10 mg Intravenous PRN Yevette EdwardsAdams, James G, MD      . ePHEDrine injection 10 mg  10 mg Intravenous PRN Yevette EdwardsAdams, James G, MD      . fentaNYL 2.5 mcg/ml w/ropivacaine 0.15% (PF) in normal saline 100 mL EPIDURAL infusion  12 mL/hr Epidural Continuous Yevette EdwardsAdams, James G, MD 12 mL/hr at 11/22/17 0726 12 mL/hr at 11/22/17 0726  . labetalol (NORMODYNE,TRANDATE) injection 20-80 mg  20-80 mg Intravenous Q10 min PRN McVey, Rebecca A, CNM   20 mg at 11/22/17 0756  .  lactated ringers infusion 500 mL  500 mL Intravenous Once Yevette Edwards, MD      . lactated ringers infusion 500-1,000 mL  500-1,000 mL Intravenous PRN McVey, Prudencio Pair, CNM      . lactated ringers infusion   Intravenous Continuous McVey, Prudencio Pair, CNM 125 mL/hr at 11/22/17 0110    . magnesium bolus via infusion 4 g  4 g Intravenous Once McVey, Rebecca A, CNM      . magnesium sulfate 40 g in lactated ringers 500 mL (0.08 g/mL) OB infusion  2 g/hr Intravenous Continuous McVey, Rebecca A, CNM      . ondansetron (ZOFRAN) injection 4 mg  4 mg Intravenous Q6H PRN McVey, Prudencio Pair, CNM      . oxytocin (PITOCIN) IV BOLUS FROM BAG  500 mL Intravenous Once McVey, Prudencio Pair, CNM      . oxytocin (PITOCIN) IV infusion 40 units in LR 1000 mL - Premix  1-40 milli-units/min Intravenous Titrated McVey, Rebecca A, CNM   Stopped at 11/22/17 0729  . oxytocin (PITOCIN) IV infusion 40 units in LR 1000 mL - Premix  2.5 Units/hr Intravenous Continuous McVey, Prudencio Pair, CNM      . penicillin G potassium 3 Million Units in dextrose 50mL IVPB  3 Million Units Intravenous Q4H McVey, Rebecca A, CNM 100 mL/hr at 11/22/17 0628 3 Million Units at 11/22/17 6045  . PHENYLephrine 40 mcg/ml in  normal saline Adult IV Push Syringe  80 mcg Intravenous PRN Yevette Edwards, MD      . PHENYLephrine 40 mcg/ml in normal saline Adult IV Push Syringe  80 mcg Intravenous PRN Yevette Edwards, MD      . sodium citrate-citric acid (ORACIT) solution 30 mL  30 mL Oral Q2H PRN McVey, Prudencio Pair, CNM      . terbutaline (BRETHINE) injection 0.25 mg  0.25 mg Subcutaneous Once PRN McVey, Prudencio Pair, CNM       Facility-Administered Medications Ordered in Other Encounters  Medication Dose Route Frequency Provider Last Rate Last Dose  . bupivacaine (PF) (MARCAINE) 0.25 % injection    Anesthesia Intra-op Yevette Edwards, MD   3 mL at 11/22/17 0725  . lidocaine (XYLOCAINE) 2 % injection    Anesthesia Intra-op Yevette Edwards, MD   3 mL at 11/22/17 0718    Assessment & Plan:  1. IUP at [redacted]w[redacted]d; GDMA2- -follow CBG closely, change to q1hr- plan to start Glucostabilizer if CBG>120.  2. GBS: Pos- 1st dose of PCN at 0317, 2nd dose given.   3. Analgesia: comfortable with epidural  4. active labor- SROM clear fluid, Pitocin currently off due to FHR tracing.   5. Pre-eclampsia with severe features- normal LFTs on CMP at admit, P/C ratio pending, but severe range BPs- Labetalol given IV, starting Mag Sulfate for sz prophy.   POC discussed and FHR tracing reviewed with Dr Allean Found, REBECCA A, CNM 11/22/17 8:05 AM

## 2017-11-22 NOTE — Progress Notes (Signed)
L&D Note  11/22/2017 - 8:50 AM  36 y.o. [id number] 258w3d . Pregnancy complicated by AMA, GDMA2, now dx pre-eclampsia with severe features.   Patient Active Problem List   Diagnosis Date Noted  . GDM, class A2 11/22/2017    [facility]   comnfortable with epidural, feeling occasional UC.  Objective:   Vitals:   11/22/17 0817 11/22/17 0820 11/22/17 0835 11/22/17 0850  BP: (!) 110/93 106/72 (!) 128/92 135/72  Pulse: 90 96 98 95  Resp:      Temp:      TempSrc:      SpO2:  95% 99% 98%  Weight:      Height:        Current Vital Signs 24h Vital Sign Ranges  T 98.3 F (36.8 C) Temp  Avg: 98.3 F (36.8 C)  Min: 98.3 F (36.8 C)  Max: 98.3 F (36.8 C)  BP 135/72 BP  Min: 106/72  Max: 172/92  HR 95 Pulse  Avg: 87  Min: 71  Max: 98  RR 18 Resp  Avg: 18  Min: 18  Max: 18  SaO2 98 % Not Delivered SpO2  Avg: 97.7 %  Min: 95 %  Max: 100 %       24 Hour I/O Current Shift I/O  Time Ins Outs 01/03 0701 - 01/04 0700 In: 250  Out: -  No intake/output data recorded.   FHR: Cat II tracing, FHR to 60bpm prolonged with maternal position change out of hands/knees. Moderate variability throughout.  Toco: q5-708min Gen: NAD SVE C/C/0, OP position  Labs:  Recent Labs  Lab 11/22/17 0102  WBC 9.6  HGB 12.7  HCT 36.7  PLT 276   Recent Labs  Lab 11/22/17 0102  NA 136  K 3.5  CL 105  CO2 21*  BUN 8  CREATININE 0.44  CALCIUM 9.1  PROT 6.5  BILITOT 0.4  ALKPHOS 129*  ALT 16  AST 22  GLUCOSE 164*    Medications Current Facility-Administered Medications  Medication Dose Route Frequency Provider Last Rate Last Dose  . acetaminophen (TYLENOL) tablet 650 mg  650 mg Oral Q4H PRN Lacresia Darwish, Prudencio Pairebecca A, CNM      . butorphanol (STADOL) injection 1 mg  1 mg Intravenous Q1H PRN Kohler Pellerito A, CNM   1 mg at 11/22/17 0511  . calcium gluconate 10 % injection           . dextrose 5 % in lactated ringers infusion   Intravenous Continuous Smokey Melott, Prudencio Pairebecca A,  CNM      . diphenhydrAMINE (BENADRYL) injection 12.5 mg  12.5 mg Intravenous Q15 min PRN Yevette EdwardsAdams, James G, MD      . ePHEDrine injection 10 mg  10 mg Intravenous PRN Yevette EdwardsAdams, James G, MD      . ePHEDrine injection 10 mg  10 mg Intravenous PRN Yevette EdwardsAdams, James G, MD      . fentaNYL 2.5 mcg/ml w/ropivacaine 0.15% (PF) in normal saline 100 mL EPIDURAL infusion  12 mL/hr Epidural Continuous Yevette EdwardsAdams, James G, MD 12 mL/hr at 11/22/17 0726 12 mL/hr at 11/22/17 0726  . labetalol (NORMODYNE,TRANDATE) 5 MG/ML injection           . labetalol (NORMODYNE,TRANDATE) injection 20-80 mg  20-80 mg Intravenous Q10 min PRN Dezmen Alcock A, CNM   20 mg at 11/22/17 0756  . lactated ringers infusion 500 mL  500 mL Intravenous Once Yevette EdwardsAdams, James G, MD      .  lactated ringers infusion 500-1,000 mL  500-1,000 mL Intravenous PRN Yocheved Depner, Prudencio Pair, CNM      . lactated ringers infusion   Intravenous Continuous Na Waldrip, Prudencio Pair, CNM 125 mL/hr at 11/22/17 0802    . lactated ringers with magnesium sulfate ADS Med           . magnesium sulfate 40 g in lactated ringers 500 mL (0.08 g/mL) OB infusion  2 g/hr Intravenous Continuous Madysyn Hanken A, CNM      . ondansetron (ZOFRAN) injection 4 mg  4 mg Intravenous Q6H PRN Reyansh Kushnir, Prudencio Pair, CNM      . oxytocin (PITOCIN) IV BOLUS FROM BAG  500 mL Intravenous Once Vyctoria Dickman, Prudencio Pair, CNM      . oxytocin (PITOCIN) IV infusion 40 units in LR 1000 mL - Premix  1-40 milli-units/min Intravenous Titrated Braydn Carneiro A, CNM   Stopped at 11/22/17 0729  . oxytocin (PITOCIN) IV infusion 40 units in LR 1000 mL - Premix  2.5 Units/hr Intravenous Continuous Shea Kapur, Prudencio Pair, CNM      . penicillin G potassium 3 Million Units in dextrose 50mL IVPB  3 Million Units Intravenous Q4H Delma Drone A, CNM 100 mL/hr at 11/22/17 0628 3 Million Units at 11/22/17 1610  . PHENYLephrine 40 mcg/ml in normal saline Adult IV Push Syringe  80 mcg Intravenous PRN Yevette Edwards, MD      . PHENYLephrine 40 mcg/ml in normal  saline Adult IV Push Syringe  80 mcg Intravenous PRN Yevette Edwards, MD      . sodium citrate-citric acid (ORACIT) solution 30 mL  30 mL Oral Q2H PRN Caisley Baxendale, Prudencio Pair, CNM      . terbutaline (BRETHINE) injection 0.25 mg  0.25 mg Subcutaneous Once PRN Taeshawn Helfman, Prudencio Pair, CNM       Facility-Administered Medications Ordered in Other Encounters  Medication Dose Route Frequency Provider Last Rate Last Dose  . bupivacaine (PF) (MARCAINE) 0.25 % injection    Anesthesia Intra-op Yevette Edwards, MD   3 mL at 11/22/17 0725  . lidocaine (XYLOCAINE) 2 % injection    Anesthesia Intra-op Yevette Edwards, MD   3 mL at 11/22/17 0718    Assessment & Plan:    1.IUPat [redacted]w[redacted]d; GDMA2-  2. Pre-eclampsia with severe features- normal LFTs on CMP at admit, P/C 0.55  3. Persistent Cat II tracing, Cat 3 with pushing attempts. No fetal descent, OP position.  Reviewed tracing with Drs Architect and Ward, counseled pt regarding need to proceed to C/S for arrest of descent and fetal intolerance.   Larz Mark A, CNM 11/22/17 9:05 AM

## 2017-11-22 NOTE — Transfer of Care (Signed)
Immediate Anesthesia Transfer of Care Note  Patient: Monica Travis  Procedure(s) Performed: CESAREAN SECTION  Patient Location: Mother/Baby  Anesthesia Type:Epidural  Level of Consciousness: awake, alert  and oriented  Airway & Oxygen Therapy: Patient Spontanous Breathing  Post-op Assessment: Report given to RN and Post -op Vital signs reviewed and stable  Post vital signs: Reviewed  Last Vitals:  Vitals:   11/22/17 0850 11/22/17 1052  BP: 135/72 (!) 141/75  Pulse: 95 90  Resp:  18  Temp:  36.7 C  SpO2: 98% 99%    Last Pain:  Vitals:   11/22/17 0800  TempSrc:   PainSc: 2          Complications: No apparent anesthesia complications

## 2017-11-22 NOTE — Progress Notes (Signed)
S: pt counseled regarding need for stat C/S due to fetal intolerance.   O: mild range BPs.  FHR 125bpm,moderate variability, prolonged decel to 60-70bpm with position change to left lateral for C/s pre-op prep.   A: Stat C/S for fetal intolerance  P:  LTCS; 11/22/17 at 0930; infant female, 6#13; Apgars 8/9

## 2017-11-22 NOTE — Anesthesia Preprocedure Evaluation (Addendum)
Anesthesia Evaluation  Patient identified by MRN, date of birth, ID band Patient awake    Reviewed: Allergy & Precautions, H&P , NPO status , Patient's Chart, lab work & pertinent test results, reviewed documented beta blocker date and time   History of Anesthesia Complications Negative for: history of anesthetic complications  Airway Mallampati: III  TM Distance: <3 FB Neck ROM: full    Dental no notable dental hx. (+) Chipped, Poor Dentition   Pulmonary Current Smoker,    Pulmonary exam normal breath sounds clear to auscultation       Cardiovascular Exercise Tolerance: Good (-) hypertension Rhythm:regular Rate:Normal     Neuro/Psych negative neurological ROS  negative psych ROS   GI/Hepatic negative GI ROS, Neg liver ROS, neg GERD  ,  Endo/Other  diabetes, Gestational  Renal/GU   negative genitourinary   Musculoskeletal   Abdominal   Peds  Hematology negative hematology ROS (+)   Anesthesia Other Findings Past Medical History: No date: Allergy  History reviewed. No pertinent surgical history.  BMI    Body Mass Index:  36.13 kg/m      Reproductive/Obstetrics (+) Pregnancy                            Anesthesia Physical Anesthesia Plan  ASA: III and emergent  Anesthesia Plan: Epidural   Post-op Pain Management:    Induction:   PONV Risk Score and Plan:   Airway Management Planned:   Additional Equipment:   Intra-op Plan:   Post-operative Plan:   Informed Consent: I have reviewed the patients History and Physical, chart, labs and discussed the procedure including the risks, benefits and alternatives for the proposed anesthesia with the patient or authorized representative who has indicated his/her understanding and acceptance.     Plan Discussed with: Anesthesiologist  Anesthesia Plan Comments: (Stat C section)       Anesthesia Quick Evaluation

## 2017-11-22 NOTE — Progress Notes (Signed)
L&D Note  11/22/2017 - 6:11 AM  36 y.o. [id number] [redacted]w[redacted]d. Pregnancy complicated by GDMA2.  Patient Active Problem List   Diagnosis Date Noted  . GDM, class A2 11/22/2017    Ms. Monica Travis is admitted for IOL due to [facility].    Subjective:  Feeling painful UCs, denies HA, VD or RUQ pain. Leaking lg amt clear fluid since SROM at 0530.  Objective:   Vitals:   11/22/17 0253 11/22/17 0315 11/22/17 0354 11/22/17 0454  BP: (!) 146/72 (!) 146/64 (!) 130/58 (!) 147/84  Pulse: 71 84 74 82  Temp: 98.3 F (36.8 C)     TempSrc: Oral     Weight:  185 lb (83.9 kg)    Height:  5' (1.524 m)      Current Vital Signs 24h Vital Sign Ranges  T 98.3 F (36.8 C) Temp  Avg: 98.3 F (36.8 C)  Min: 98.3 F (36.8 C)  Max: 98.3 F (36.8 C)  BP (!) 147/84 BP  Min: 130/58  Max: 147/84  HR 82 Pulse  Avg: 77.8  Min: 71  Max: 84  RR   No Data Recorded  SaO2   Not Delivered No Data Recorded       24 Hour I/O Current Shift I/O  Time Ins Outs 01/03 0701 - 01/04 0700 In: 250  Out: -  01/03 1901 - 01/04 0700 In: 250  Out: -    FHR: Cat II tracing, 135bpm, mod variability, + accels, variable and early decels.  Toco: q2-88min, Pitocin at 50mu/min  Gen: NAD, tired appearing. DTRs 2+, trace edema.   Dilation: 3.5 Effacement (%): 70 Cervical Position: Posterior Station: -2 Presentation: Vertex Exam by:: ACliffton Asters, RN  Labs:  Recent Labs  Lab 11/22/17 0102  WBC 9.6  HGB 12.7  HCT 36.7  PLT 276   Recent Labs  Lab 11/22/17 0102  NA 136  K 3.5  CL 105  CO2 21*  BUN 8  CREATININE 0.44  CALCIUM 9.1  PROT 6.5  BILITOT 0.4  ALKPHOS 129*  ALT 16  AST 22  GLUCOSE 164*    Medications Current Facility-Administered Medications  Medication Dose Route Frequency Provider Last Rate Last Dose  . acetaminophen (TYLENOL) tablet 650 mg  650 mg Oral Q4H PRN McVey, Prudencio Pair, CNM      . butorphanol (STADOL) injection 1 mg  1 mg Intravenous Q1H PRN McVey, Rebecca A, CNM   1 mg at 11/22/17  0511  . dextrose 5 % in lactated ringers infusion   Intravenous Continuous McVey, Prudencio Pair, CNM      . lactated ringers infusion 500-1,000 mL  500-1,000 mL Intravenous PRN McVey, Prudencio Pair, CNM      . lactated ringers infusion   Intravenous Continuous McVey, Prudencio Pair, CNM 125 mL/hr at 11/22/17 0110    . lidocaine (PF) (XYLOCAINE) 1 % injection 30 mL  30 mL Subcutaneous PRN McVey, Prudencio Pair, CNM      . ondansetron (ZOFRAN) injection 4 mg  4 mg Intravenous Q6H PRN McVey, Prudencio Pair, CNM      . oxytocin (PITOCIN) IV BOLUS FROM BAG  500 mL Intravenous Once McVey, Prudencio Pair, CNM      . oxytocin (PITOCIN) IV infusion 40 units in LR 1000 mL - Premix  1-40 milli-units/min Intravenous Titrated McVey, Rebecca A, CNM 10.5 mL/hr at 11/22/17 0500 7 milli-units/min at 11/22/17 0500  . oxytocin (PITOCIN) IV infusion 40 units in LR 1000 mL - Premix  2.5  Units/hr Intravenous Continuous McVey, Prudencio Pairebecca A, CNM      . penicillin G potassium 3 Million Units in dextrose 50mL IVPB  3 Million Units Intravenous Q4H McVey, Rebecca A, CNM      . sodium citrate-citric acid (ORACIT) solution 30 mL  30 mL Oral Q2H PRN McVey, Prudencio Pairebecca A, CNM      . terbutaline (BRETHINE) injection 0.25 mg  0.25 mg Subcutaneous Once PRN McVey, Prudencio Pairebecca A, CNM        Assessment & Plan:  1. IUP at 1473w3d; GDMA2- -follow CBG closely, change to q1hr- plan to start Glucostabilizer if CBG>120.  2. GBS: Pos- 1st dose of PCN at 0317  3. Analgesia: has had 1 dose of stadol, considering epidural  4. Early labor- SROM clear fluid, cx changing per RN exam.   5. Continues to have mild range BPs- normal LFTs on CMP at admit, will obtain P/C ratio now.

## 2017-11-22 NOTE — Op Note (Signed)
Cesarean Section Procedure Note  11/22/2017   Patient:  Monica Travis  37 y.o. female at 4885w3d.  No LMP recorded. Preoperative diagnosis:  Unscheduled Cesarean Section, See Delivery Summary Postoperative diagnosis:  Unscheduled Cesarean Section, See Delivery Summary  PROCEDURE:  Procedure(s): CESAREAN SECTION Surgeon:  Surgeon(s) and Role:   Ranae Plumberhelsea Artavius Stearns, MD   Heloise Ochoaebecca McVey, CNM Anesthesia:  epidrual I/O: Total I/O In: 300 [I.V.:300] Out: 950 [Urine:150; Blood:800] Specimens:  placenta Complications: None Apparent Disposition:  VS stable to PACU  Findings: normal uterus, tubes and ovaries bilaterally Live born female  Birth Weight: 6 lb 13 oz (3090 g) APGAR: 8, 9  Newborn Delivery   Birth date/time:  11/22/2017 09:30:00 Delivery type:  C-Section, Low Transverse C-section categorization:  Primary      Indication for procedure: 37 y.o. female at 4685w3d in second stage of labor with prolonged decels to the 60s with any pusing or any positioning other than hand and knees.  Pitocin was stopped and with any change of position there were prolonged decels still.  With category 2 strip, we proceeded with a STAT cesarean.   Procedure Details   The risks, benefits, complications, treatment options, and expected outcomes were discussed with the patient. Informed consent was obtained. The patient was taken to Operating Room, identified as Monica Travis and the procedure verified as a cesarean delivery.   After administration of anesthesia, the patient was draped in the usual sterile manner using iodoband. A surgical time out was performed, with the pediatric team present. After confirming adequate anesthesia, a Pfannenstiel incision was made and carried down through the subcutaneous tissue to the fascia. Fascial incision was made and extended transversely. The fascia was stretched. The peritoneum was identified and entered, again stretched.  A low transverse uterine incision was  made. With hand assist from below, the baby was delivered from cephalic, OP presentation and was a live born female. Delayed cord clamping was performed for 30 seconds. The umbilical cord was doubly clamped and cut, and the baby was handed off to the awaitng pediatrician.  The placenta was removed intact and appeared normal. The uterus was delivered from the abdominal cavity and cleared of clots, membranes, and debris. The uterus, tubes and ovaries appeared normal. There were bilateral extensions into the cervix. The uterine incision was closed with running locking sutures of 0 Vicryl, and then a second, then third imbricating stitch was placed. Hemostasis was observed. The uterus was still boggy and 1 dose of methergine was given. The abdominal cavity was evacuated of extraneous fluid. The uterus was returned to the abdominal cavity and again the incision was inspected for hemostasis, which was confirmed.  The paracolic gutters were cleaned. The fascia was then reapproximated with running suture of vicryl. 80cc of Long- and short-acting bupivicaine was injected circumferentially into the fascia.  After a change of gloves, the subcutaneous tissue was irrigated and reapproximated with 3-0 vicryl. The skin was closed with absorbable staples, and 20cc of long- and short-acting bupivacaine injected into the skin and subcutaneous tissues.  The incision was covered with surgical glue.     Due to STAT nature, a preoperative count was not performed.  After the case, Xray showed no instruments in the patient's abdomen.    I was present and performed this procedure in its entirety.  ----- Ranae Plumberhelsea Drayven Marchena, MD Attending Obstetrician and Gynecologist [facility], Department of OB/GYN [facility]

## 2017-11-22 NOTE — Progress Notes (Signed)
Patient ID: Monica Travis, female   DOB: 1981-07-27, 37 y.o.   MRN: [id number] VSS   on magnesium with good urine output  Cont care

## 2017-11-22 NOTE — Anesthesia Procedure Notes (Signed)
Epidural Patient location during procedure: OB  Staffing Performed: anesthesiologist   Preanesthetic Checklist Completed: patient identified, site marked, surgical consent, pre-op evaluation, timeout performed, IV checked, risks and benefits discussed and monitors and equipment checked  Epidural Patient position: sitting Prep: Betadine Patient monitoring: heart rate, continuous pulse ox and blood pressure Approach: midline Location: L4-L5 Injection technique: LOR saline  Needle:  Needle type: Tuohy  Needle gauge: 17 G Needle length: 9 cm and 9 Needle insertion depth: 6 cm Catheter type: closed end flexible Catheter size: 19 Gauge Catheter at skin depth: 12 cm Test dose: negative and 1.5% lidocaine with Epi 1:200 K  Assessment Sensory level: T10 Events: blood not aspirated, injection not painful, no injection resistance, negative IV test and no paresthesia  Additional Notes   Patient tolerated the insertion well without complications.-SATD -IVTD. No paresthesia. Refer to [facility] nursing for VS and dosingReason for block:procedure for pain     

## 2017-11-22 NOTE — Progress Notes (Signed)
Late entry, from delivery.  I received a call from CNM McVey asking me to review the strip for LDR 1.  The on-call provider was in a cesarean surgery and could not review.  I was unfamiliar with this patient in whole. She explained that the patient was a G4, 10cm and 0 station and was having decelerations in every position except hands/knees and would also have decelerations with pushing.  The pitocin had been stopped and IV bolus running, O2 applied.  I reviewed the strip at the time of the call, and it was a concerning category 2 strip.  Given that she was complete and could not push without a decel, or tolerate any other position without a prolonged decel, I recommended cesarean delivery.  In the knee/chest position and no contractions, the fetal heart rate was category 1, with a normal baseline and moderate variability.  I proposed that the on-call physician would be soon done with his scheduled surgery and that if fetus tolerated this then we could set up the room and be ready to go when he was about to step out of his OR.  Shortly thereafter I received another phone call to say the on-call physician was unable to do the cesarean as there were complications in his.  I instructed her at that time to get the OR ready and get the patient prepped and I would do it.    Soon after that I received another phone call that said they needed me now.  I ran from my office to the L&D floor.  Upon arrival, the patient was still on hands/knees in her labor room, labs being drawn, and the OR was just then being opened for the surgery.  The team then moved quickly to get the patient into the OR.  The strip was still normal baseline and moderate variability. The concern was that when she laid flat for the cesarean, the heart rate would plummet as it had previously.  Thus, after a bolus of the epidural, we proceeded stat.  See OP note.  ----- Ranae Plumberhelsea Kadedra Vanaken, MD Attending Obstetrician and Gynecologist [facility], Department of OB/GYN [facility]

## 2017-11-22 NOTE — H&P (Signed)
OB History & Physical   History of Present Illness:  Chief Complaint: gestational diabetes  HPI:  Monica Travis is a 37 y.o. [phone] female at 205w3d dated by No LMP recorded. PatientAshby Dawes is pregnant. LMP 02/19/17.  Estimated Date of Delivery: 11/26/17. She presents to L&D for IOL [facility].   Active FM Denies UC, VB or LOF.     Pregnancy Issues: 1. GDMA2- Per prenatal record documentation, Pt was noncompliant with recommendations during pregnancy and refused to see high risk clinic or MFM consult, pt unwilling to take medications until late in pregnancy. Currently taking Metformin 1000mg  PO once daily.    Maternal Medical History:   Past Medical History:  Diagnosis Date  . Allergy     History reviewed. No pertinent surgical history.  No Known Allergies  Prior to Admission medications   Medication Sig Start Date End Date Taking? Authorizing Provider  cetirizine (ZYRTEC) 10 MG tablet Take 10 mg by mouth daily.   Yes [provider]  metFORMIN (GLUCOPHAGE) 500 MG tablet Take 500 mg by mouth 2 (two) times daily with a meal.   Yes [provider]  Prenatal Vit-Fe Fumarate-FA (PRENATAL MULTIVITAMIN) TABS tablet Take 1 tablet by mouth daily at 12 noon.   Yes [provider]  amoxicillin (AMOXIL) 875 MG tablet Take 1 tablet (875 mg total) by mouth 2 (two) times daily. Patient not taking: Reported on 01/11/2017 09/28/16   Payton Mccallumonty, [place], MD     [facility]   Social History: She  reports that  has never smoked. she has never used smokeless tobacco. She reports that she does not drink alcohol.  Family History: family history is not on file.   Review of Systems: A full review of systems was performed and negative except as noted in the HPI.     Physical Exam:  Vital Signs: BP (!) 146/72 (BP Location: Right Arm)   Pulse 71   Temp 98.3 F (36.8 C) (Oral)  General: no acute distress.  HEENT: normocephalic, atraumatic Heart:  regular rate & rhythm.  No murmurs/rubs/gallops Lungs: clear to auscultation bilaterally, normal respiratory effort Abdomen: soft, gravid, non-tender;  EFW: 8lbs Pelvic:   External: Normal external female genitalia  Cervix: Dilation: 2 / Effacement (%): 50 / Station: Ballotable    Extremities: non-tender, symmetric, trace edema bilaterally.  DTRs: 2+  Neurologic: Alert & oriented x 3.    Results for orders placed or performed during the hospital encounter of 11/22/17 (from the past 24 hour(s))  CBC     Status: None   Collection Time: 11/22/17  1:02 AM  Result Value Ref Range   WBC 9.6 3.6 - 11.0 K/uL   RBC 4.00 3.80 - 5.20 MIL/uL   Hemoglobin 12.7 12.0 - 16.0 g/dL   HCT 45.436.7 09.835.0 - 11.947.0 %   MCV 91.7 80.0 - 100.0 fL   MCH 31.7 26.0 - 34.0 pg   MCHC 34.6 32.0 - 36.0 g/dL   RDW 14.714.2 82.911.5 - 56.214.5 %   Platelets 276 150 - 440 K/uL  Comprehensive metabolic panel     Status: Abnormal   Collection Time: 11/22/17  1:02 AM  Result Value Ref Range   Sodium 136 135 - 145 mmol/L   Potassium 3.5 3.5 - 5.1 mmol/L   Chloride 105 101 - 111 mmol/L   CO2 21 (L) 22 - 32 mmol/L   Glucose, Bld 164 (H) 65 - 99 mg/dL   BUN 8 6 - 20 mg/dL  Creatinine, Ser 0.44 0.44 - 1.00 mg/dL   Calcium 9.1 8.9 - 60.4 mg/dL   Total Protein 6.5 6.5 - 8.1 g/dL   Albumin 3.1 (L) 3.5 - 5.0 g/dL   AST 22 15 - 41 U/L   ALT 16 14 - 54 U/L   Alkaline Phosphatase 129 (H) 38 - 126 U/L   Total Bilirubin 0.4 0.3 - 1.2 mg/dL   GFR calc non Af Amer >60 >60 mL/min   GFR calc Af Amer >60 >60 mL/min   Anion gap 10 5 - 15  Type and screen     Status: None (Preliminary result)   Collection Time: 11/22/17  1:02 AM  Result Value Ref Range   ABO/RH(D) PENDING    Antibody Screen PENDING    Sample Expiration      11/25/2017 Performed at [facility], [address] [place], [place] [address]   Glucose, capillary     Status: Abnormal   Collection Time: 11/22/17  2:13 AM  Result Value Ref Range   Glucose-Capillary 116  (H) 65 - 99 mg/dL    Pertinent Results:  Prenatal Labs: Blood type/Rh A Pos  Antibody screen neg  Rubella Immune  Varicella Immune  RPR NR  HBsAg Neg  HIV NR  GC neg  Chlamydia neg  Genetic screening negative  1 hour GTT 161  3 hour GTT [phone]  GBS Pos   FHT: Baseline 145bpm Moderate variability Accels: present x 2 Decels: single isolated variable decel at 0153; spontaneous recovery  TOCO: occasional UC SVE:  Dilation: 2 / Effacement (%): 50 / Station: Ballotable SVE by CNM: 2/50/-3, soft/posterior.    Cephalic by leopolds and exam  No results found.  Assessment:  Monica Travis is a 37 y.o. [phone] female at [redacted]w[redacted]d with GDMA2.   Plan:  1. Admit to Labor & Delivery; consents reviewed and obtained  2. Fetal Well being  - Fetal Tracing: Cat II tracing -notified by RN at 0215 of FHR decel at 0153. Occasional variable decels noted, not associated with UCs. Will need to monitor closely.  - Group B Streptococcus ppx indicated: PCN ordered - Presentation: cephalic confirmed by exam and leopolds   3. Routine OB: - Prenatal labs reviewed, as above - Rh: A Pos - CBC, T&S, RPR on admit - Clear fluids, IVF  4. Induction of Labor -  Contractions: irregular occasional UC, external toco in place -  Pelvis proven to 8#8 -  Plan for induction with Pitocin -  Plan for continuous fetal monitoring  -  Maternal pain control as desired; discussed IVPM, nitrous, regional anesthesia - Anticipate vaginal delivery  5. GDMA2- plan for CBG q2hrs until active labor, then q1h, use Glucostabilizer if CBG >120  6. Post Partum Planning: - Infant feeding: both - Contraception: considering LARC  McVey, REBECCA A, CNM 11/22/17 3:13 AM

## 2017-11-22 NOTE — Progress Notes (Signed)
Inpatient Diabetes Program Recommendations  AACE/ADA: New Consensus Statement on Inpatient Glycemic Control (2015)  Target Ranges:  Prepandial:   less than 140 mg/dL      Peak postprandial:   less than 180 mg/dL (1-2 hours)      Critically ill patients:  140 - 180 mg/dL   Results for Monica Travis, Niko (MRN [id number]) as of 11/22/2017 11:00  Ref. Range 11/22/2017 01:02 11/22/2017 09:20  Glucose Latest Ref Range: 65 - 99 mg/dL 409164 (H) 811125 (H)   Review of Glycemic Control  Diabetes history: GDM Outpatient Diabetes medications: Metformin 500 mg BID Current orders for Inpatient glycemic control: None  Inpatient Diabetes Program Recommendations: Correction (SSI): May want to consider ordering CBGs with Novolog 0-9 units TID with meals and Novolog 0-5 units QHS.  NOTE: Per chart review, noted patient has GDM and was prescribed Metformin for DM control. Note patient had c-section today and she was given Decadron 10 mg x 1 @ 10:05 am today which is likely going to cause hyperglycemia. May want to use Glycemic Control order set to order CBGs and Novolog sensitive correction scale if needed.  Thanks, Orlando PennerMarie Niccolas Loeper, RN, MSN, CDE Diabetes Coordinator Inpatient Diabetes Program [phone] (Team Pager from 8am to 5pm)

## 2017-11-22 NOTE — Discharge Summary (Signed)
Obstetric Discharge Summary   Patient ID: Patient Name: Monica Travis DOB: [birth year] MRN: [id number]  Date of Admission: 11/22/2017 Date of Delivery: 11/22/17 Delivered by: C. Ward MD Date of Discharge: 11/25/2017  Primary OB:  Princella Ion  LMP:No LMP recorded. EDC Estimated Date of Delivery: 11/26/17 Gestational Age at Delivery: [redacted]w[redacted]d  Antepartum complications: AMA, GDMA2- poor compliance,  Admitting Diagnosis: GDMA2  Secondary Diagnoses: Patient Active Problem List   Diagnosis Date Noted  . GDM, class A2 11/22/2017    Augmentation: Pitocin Complications: None   Intrapartum complications/course: Pre-eclampsia with severe features, fetal intolerance of labor, Cat II tracing. Delivery Type: primary cesarean section, low transverse incision Anesthesia: epidural Placenta: manual Laceration: none Episiotomy: none  Newborn Data: Live born female  Birth Weight: 6 lb 13 oz (3090 g) APGAR: 8, 9  Newborn Delivery   Birth date/time:  11/22/2017 09:30:00 Delivery type:  C-Section, Low Transverse C-section categorization:  Primary    Postpartum Course  Patient had an antepartum and postpartum course complicated by a diagnosis of preeclampsia with severe features. She met criteria with two severe range blood pressures and an elevated protein/creatinine ratio on the early morning of January 4th, just before she underwent her cesarean section. She was given a bolus of magnesium sulfate followed by a 24-hour course, at which point she was transferred from labor & delivery to the women's care unit for postpartum care.   By time of discharge on POD#3, her pain was controlled on oral pain medications; she had appropriate lochia and was ambulating, voiding without difficulty and tolerating regular diet.  She was deemed stable for discharge to home.       Labs: CBC Latest Ref Rng & Units 11/24/2017 11/23/2017 11/22/2017  WBC 3.6 - 11.0 K/uL 9.7 10.8 12.0(H)  Hemoglobin 12.0 - 16.0 g/dL  9.4(L) 9.8(L) 13.3  Hematocrit 35.0 - 47.0 % 26.7(L) 28.6(L) 38.4  Platelets 150 - 440 K/uL 218 229 283   A POS  Physical exam:  BP 140/72 (BP Location: Right Arm)   Pulse 98   Temp 98.3 F (36.8 C) (Oral)   Resp 18   Ht 5' (1.524 m)   Wt 185 lb (83.9 kg)   SpO2 98%   Breastfeeding? Unknown   BMI 36.13 kg/m  General: alert and no distress Pulm: normal respiratory effort Lochia: appropriate Abdomen: soft, NT Uterine Fundus: firm, below umbilicus Incision: c/d/i, healing well, no significant drainage, no dehiscence, no significant erythema    Disposition: stable, discharge to home Baby Feeding: formula Baby Disposition: home with mom  Contraception: desires OCPs  Prenatal Labs:  A POS/RI/VI/HepBneg/RPR NR/HIV neg/GBS Pos Rh Immune globulin given: n/a  Plan:  GShamecka Hocuttwas discharged to home in good condition. [facility] [place]  Discharge Instructions: Per After Visit Summary. Activity: Advance as tolerated. Pelvic rest for 6 weeks.   Diet: Regular Discharge Medications: Allergies as of 11/25/2017   No Known Allergies     Medication List    STOP taking these medications   amoxicillin 875 MG tablet Commonly known as:  AMOXIL   metFORMIN 500 MG tablet Commonly known as:  GLUCOPHAGE     TAKE these medications   acetaminophen 500 MG tablet Commonly known as:  TYLENOL Take 2 tablets (1,000 mg total) by mouth every 6 (six) hours.   cetirizine 10 MG tablet Commonly known as:  ZYRTEC Take 10 mg by mouth daily.   ibuprofen 600 MG tablet  Commonly known as:  ADVIL,MOTRIN Take 1 tablet (600 mg total) by mouth every 6 (six) hours.   oxyCODONE 5 MG immediate release tablet Commonly known as:  Oxy IR/ROXICODONE Take 1 tablet (5 mg total) by mouth every 4 (four) hours as needed (pain scale 4-7).   prenatal multivitamin Tabs tablet Take 1 tablet by mouth daily at 12 noon.    senna-docusate 8.6-50 MG tablet Commonly known as:  Senokot-S Take 2 tablets by mouth daily.   simethicone 80 MG chewable tablet Commonly known as:  MYLICON Chew 2 tablets (160 mg total) by mouth 4 (four) times daily as needed for flatulence.      Outpatient follow up:  Follow-up [facility]. Go in 2 week(s).   Why:  appt for 2 weeks post-op with Dr Vikki Ports Ward Contact information: [place] [phone]           Signed:   Francetta Found, CNM 11/25/17 11:54 AM

## 2017-11-23 LAB — CBC
HCT: 28.6 % — ABNORMAL LOW (ref 35.0–47.0)
Hemoglobin: 9.8 g/dL — ABNORMAL LOW (ref 12.0–16.0)
MCH: 31.9 pg (ref 26.0–34.0)
MCHC: 34.1 g/dL (ref 32.0–36.0)
MCV: 93.4 fL (ref 80.0–100.0)
PLATELETS: 229 10*3/uL (ref 150–440)
RBC: 3.06 MIL/uL — ABNORMAL LOW (ref 3.80–5.20)
RDW: 14 % (ref 11.5–14.5)
WBC: 10.8 10*3/uL (ref 3.6–11.0)

## 2017-11-23 LAB — RPR: RPR: NONREACTIVE

## 2017-11-23 MED ORDER — IBUPROFEN 600 MG PO TABS
600.0000 mg | ORAL_TABLET | Freq: Four times a day (QID) | ORAL | Status: DC
  Administered 2017-11-23 – 2017-11-25 (×7): 600 mg via ORAL
  Filled 2017-11-23 (×7): qty 1

## 2017-11-23 NOTE — Anesthesia Postprocedure Evaluation (Signed)
Anesthesia Post Note  Patient: Monica Travis  Procedure(s) Performed: CESAREAN SECTION  Patient location during evaluation: L&D Anesthesia Type: Epidural Level of consciousness: awake and alert Pain management: pain level controlled Vital Signs Assessment: post-procedure vital signs reviewed and stable Respiratory status: spontaneous breathing, nonlabored ventilation and respiratory function stable Cardiovascular status: stable Postop Assessment: no headache and no backache (able to move both legs and feet) Anesthetic complications: no     Last Vitals:  Vitals:   11/23/17 0727 11/23/17 0827  BP: 121/63 125/67  Pulse: 83 91  Resp: 16 16  Temp:    SpO2:      Last Pain:  Vitals:   11/23/17 0727  TempSrc:   PainSc: 0-No pain                 Cleda MccreedyJoseph K Lajuana Patchell

## 2017-11-23 NOTE — Anesthesia Post-op Follow-up Note (Signed)
  Anesthesia Pain Follow-up Note  Patient: Monica Travis  Day #: 1  Date of Follow-up: 11/23/2017 Time: 8:53 AM  Last Vitals:  Vitals:   11/23/17 0727 11/23/17 0827  BP: 121/63 125/67  Pulse: 83 91  Resp: 16 16  Temp:    SpO2:      Level of Consciousness: alert  Pain: mild   Side Effects:None  Catheter Site Exam:clean, dry     Plan: D/C from anesthesia care at surgeon's request  Cleda MccreedyJoseph K Kinser Fellman

## 2017-11-23 NOTE — Progress Notes (Signed)
Post-Op Day 1  Subjective: Patient resting in bed, partner resting at the bedside. Has not been out of bed yet, but pain is well managed with PO meds. Tolerating regular diet. Indwelling urinary catheter still in place, to be removed this morning.   No fever/chills, chest pain, shortness of breath, nausea/vomiting, or leg pain. No nipple or breast pain. No headache, visual changes, or RUQ/epigastric pain.  Objective: BP (!) 107/50   Pulse 79   Temp 98.5 F (36.9 C) (Oral)   Resp 16   Ht 5' (1.524 m)   Wt 83.9 kg (185 lb)   SpO2 100%   Breastfeeding? Unknown   BMI 36.13 kg/m   VS comments: all normal range BPs in the last 12 hours range 107-128/50-68   Physical Exam:  General: alert, cooperative, appears stated age and mild distress Breasts: soft/nontender CV: RRR Pulm: nl effort, CTABL Abdomen: soft, non-tender, active bowel sounds Uterine Fundus: firm Incision: healing well, no significant drainage Lochia: appropriate Urinary: Foley catheter in place draining clear, yellow urine DVT Evaluation: No evidence of DVT seen on physical exam. No cords or calf tenderness. No significant calf/ankle edema.  Recent Labs    11/22/17 0920 11/23/17 0752  HGB 13.3 9.8*  HCT 38.4 28.6*  WBC 12.0* 10.8  PLT 283 229    Assessment/Plan: 36 y.o. G5P3013 postop day # 1 from primary c/s for fetal intolerance of labor and failure to progress  -Preeclampsia with severe features: based on two severe range BPs and elevated protein/creatinine ratio. All blood pressures within the last 24 hours have been mild range, and all within the last 12 hours have been normal range. No S/S of preeclampsia. Magnesium finished infusing this morning. Appropriate urine output.  -Continue routine PP care.  -Plans formula feeding. Encouraged snug fitting bra, Tylenol, cold application, and cabbage leaves as needed for engorgement for bottlefeeding.  -Acute blood loss anemia - hemodynamically stable and  asymptomatic; continue PNV with iron and stool softeners, recheck CBC tomorrow.  Disposition: Plan to transfer to postpartum floor and continue routine postpartum care.    LOS: 1 day   Monica Travis, CNM 11/23/2017, 10:28 AM   ----- Monica Travis Certified Nurse Midwife [facility]

## 2017-11-23 NOTE — Addendum Note (Signed)
Addendum  created 11/23/17 [id number] by Shiane Wenberg, Cleda MccreedyJoseph K, MD   Sign clinical note

## 2017-11-24 LAB — CBC
HCT: 26.7 % — ABNORMAL LOW (ref 35.0–47.0)
Hemoglobin: 9.4 g/dL — ABNORMAL LOW (ref 12.0–16.0)
MCH: 32.9 pg (ref 26.0–34.0)
MCHC: 35.2 g/dL (ref 32.0–36.0)
MCV: 93.6 fL (ref 80.0–100.0)
PLATELETS: 218 10*3/uL (ref 150–440)
RBC: 2.85 MIL/uL — ABNORMAL LOW (ref 3.80–5.20)
RDW: 14.8 % — AB (ref 11.5–14.5)
WBC: 9.7 10*3/uL (ref 3.6–11.0)

## 2017-11-24 NOTE — Plan of Care (Signed)
Pt. Is alert and oriented with aprop. Affect. Color good, skin W&D. Afeb. VSS. No s/s PIH. Lower abd. Incision is well approximated and no s/s complications. Lochial Flow is scant at U/1. Pt. States pain relief this shift with scheduled Ibuprofen and Tlenol. Is caring for self and Infant appropriately.

## 2017-11-24 NOTE — Progress Notes (Signed)
Post-Op Day 2  Subjective: Doing well, no concerns. Worried about pain with ambulation, pain treated with PO meds. Tolerating regular diet and voiding without difficulty.   No fever/chills, chest pain, shortness of breath, nausea/vomiting, or leg pain. No nipple or breast pain. No headache, visual changes, or RUQ/epigastric pain.  Objective: BP 131/62 (BP Location: Right Arm)   Pulse 99   Temp 99 F (37.2 C) (Oral)   Resp 16   Ht 5' (1.524 m)   Wt 83.9 kg (185 lb)   SpO2 98%   Breastfeeding? Unknown   BMI 36.13 kg/m     Physical Exam:  General: alert, cooperative, appears stated age and no distress Breasts: soft/nontender CV: RRR Pulm: nl effort, CTABL Abdomen: soft, non-tender, active bowel sounds Uterine Fundus: firm Incision: healing well Lochia: appropriate DVT Evaluation: No evidence of DVT seen on physical exam. No cords or calf tenderness. No significant calf/ankle edema.  Recent Labs    11/23/17 0752 11/24/17 0515  HGB 9.8* 9.4*  HCT 28.6* 26.7*  WBC 10.8 9.7  PLT 229 218    Assessment/Plan: 36 y.o. [id number] postop day #2  -Continue routine PP care. -All blood pressures within the last 24 hours have normal range. No S/S of preeclampsia. Appropriate urine output.  -Plans formula feeding. Encouraged snug fitting bra, Tylenol, cold application, and cabbage leaves as needed for engorgement for bottlefeeding.  -Acute blood loss anemia - hemodynamically stable and asymptomatic; continue PNV with iron and stool softeners.  -Encouraged increased ambulation.   Disposition: Continue postpartum care with likely discharge home tomorrow.    LOS: 2 days   Monica Travis, CNM 11/24/2017, 10:06 AM   ----- Monica Travis Certified Nurse Midwife [facility]

## 2017-11-25 LAB — SURGICAL PATHOLOGY

## 2017-11-25 MED ORDER — IBUPROFEN 600 MG PO TABS: 600.0000 mg | ORAL_TABLET | Freq: Four times a day (QID) | ORAL | 0 refills | Status: AC

## 2017-11-25 MED ORDER — OXYCODONE HCL 5 MG PO TABS: 5.0000 mg | ORAL_TABLET | ORAL | 0 refills | Status: DC | PRN

## 2017-11-25 MED ORDER — ACETAMINOPHEN 500 MG PO TABS: 1000.0000 mg | ORAL_TABLET | Freq: Four times a day (QID) | ORAL | 0 refills | Status: AC

## 2017-11-25 MED ORDER — SENNOSIDES-DOCUSATE SODIUM 8.6-50 MG PO TABS: 2.0000 | ORAL_TABLET | ORAL | 0 refills | Status: DC

## 2017-11-25 MED ORDER — SIMETHICONE 80 MG PO CHEW: 160.0000 mg | CHEWABLE_TABLET | Freq: Four times a day (QID) | ORAL | 0 refills | Status: DC | PRN

## 2017-11-25 NOTE — Progress Notes (Signed)
Post Partum Day 3 Subjective: Doing well, no complaints.  Tolerating regular diet, pain with PO meds, voiding and ambulating without difficulty.  No CP SOB Fever,Chills, N/V or leg pain; denies nipple or breast pain no HA change of vision, RUQ/epigastric pain  Objective: BP 140/72 (BP Location: Right Arm)   Pulse 98   Temp 98.3 F (36.8 C) (Oral)   Resp 18   Ht 5' (1.524 m)   Wt 185 lb (83.9 kg)   SpO2 98%   Breastfeeding? Unknown   BMI 36.13 kg/m    Physical Exam:  General: NAD Breasts: soft/nontender CV: RRR Pulm: nl effort, CTABL Abdomen: soft, NT, BS x 4 Incision:  CDI/ no erythema or drainage  Lochia: moderate Uterine Fundus: fundus firm and 2 fb below umbilicus DVT Evaluation: no cords, ttp LEs   Recent Labs    11/23/17 0752 11/24/17 0515  HGB 9.8* 9.4*  HCT 28.6* 26.7*  WBC 10.8 9.7  PLT 229 218    Assessment/Plan: 36 y.o. [id number] postpartum/PO day # 3  1. S/p C/s for fetal intolerance 2. Pre-eclampsia with sev features- asymptomatic now, occasional mild range BP. Dc home with precautions and f/u 2 weeks.   - Continue routine PP care - encouraged snug fitting bra and cabbage leaves for bottlefeeding.  - Discussed contraceptive options including implant, IUDs hormonal and non-hormonal, injection, pills/ring/patch, condoms, and NFP: Pt desires OCPs- to start after PP appt.  - Immunization status:  all Imms up to date    Disposition: Does desire Dc home today.     Nyjah Denio A, CNM 11/25/17 11:48 AM

## 2017-11-25 NOTE — Progress Notes (Signed)
Patient discharged home with infant and family. Discharge instructions, prescriptions, hygiene kit and follow up appointment given to and reviewed with patient and family. Patient verbalized understanding. Escorted out via wheelchair by auxiliary.  

## 2017-11-28 ENCOUNTER — Ambulatory Visit: Admission: RE | Admit: 2017-11-28 | Payer: BLUE CROSS/BLUE SHIELD | Source: Ambulatory Visit

## 2018-12-24 ENCOUNTER — Other Ambulatory Visit: Payer: Self-pay | Admitting: Primary Care

## 2018-12-24 DIAGNOSIS — N6323 Unspecified lump in the left breast, lower outer quadrant: Secondary | ICD-10-CM

## 2018-12-25 ENCOUNTER — Other Ambulatory Visit: Payer: Self-pay | Admitting: Primary Care

## 2018-12-25 DIAGNOSIS — N6323 Unspecified lump in the left breast, lower outer quadrant: Secondary | ICD-10-CM

## 2018-12-31 ENCOUNTER — Inpatient Hospital Stay: Admission: RE | Admit: 2018-12-31 | Payer: BLUE CROSS/BLUE SHIELD | Source: Ambulatory Visit

## 2018-12-31 ENCOUNTER — Other Ambulatory Visit: Payer: BLUE CROSS/BLUE SHIELD

## 2019-01-09 ENCOUNTER — Ambulatory Visit
Admission: RE | Admit: 2019-01-09 | Discharge: 2019-01-09 | Disposition: A | Discharge status: Home / Self Care | Payer: BLUE CROSS/BLUE SHIELD | Source: Ambulatory Visit | Attending: Primary Care | Admitting: Primary Care

## 2019-01-09 ENCOUNTER — Encounter: Payer: Self-pay | Admitting: Radiology

## 2019-01-09 DIAGNOSIS — N6323 Unspecified lump in the left breast, lower outer quadrant: Secondary | ICD-10-CM

## 2019-01-10 IMAGING — DX DG ABDOMEN 1V
1 series · 1 of 1 positions shown · non-contrast
Comparison: None.

CLINICAL DATA: Stat C-section.

EXAM:
ABDOMEN - 1 VIEW

[abdomen kub]
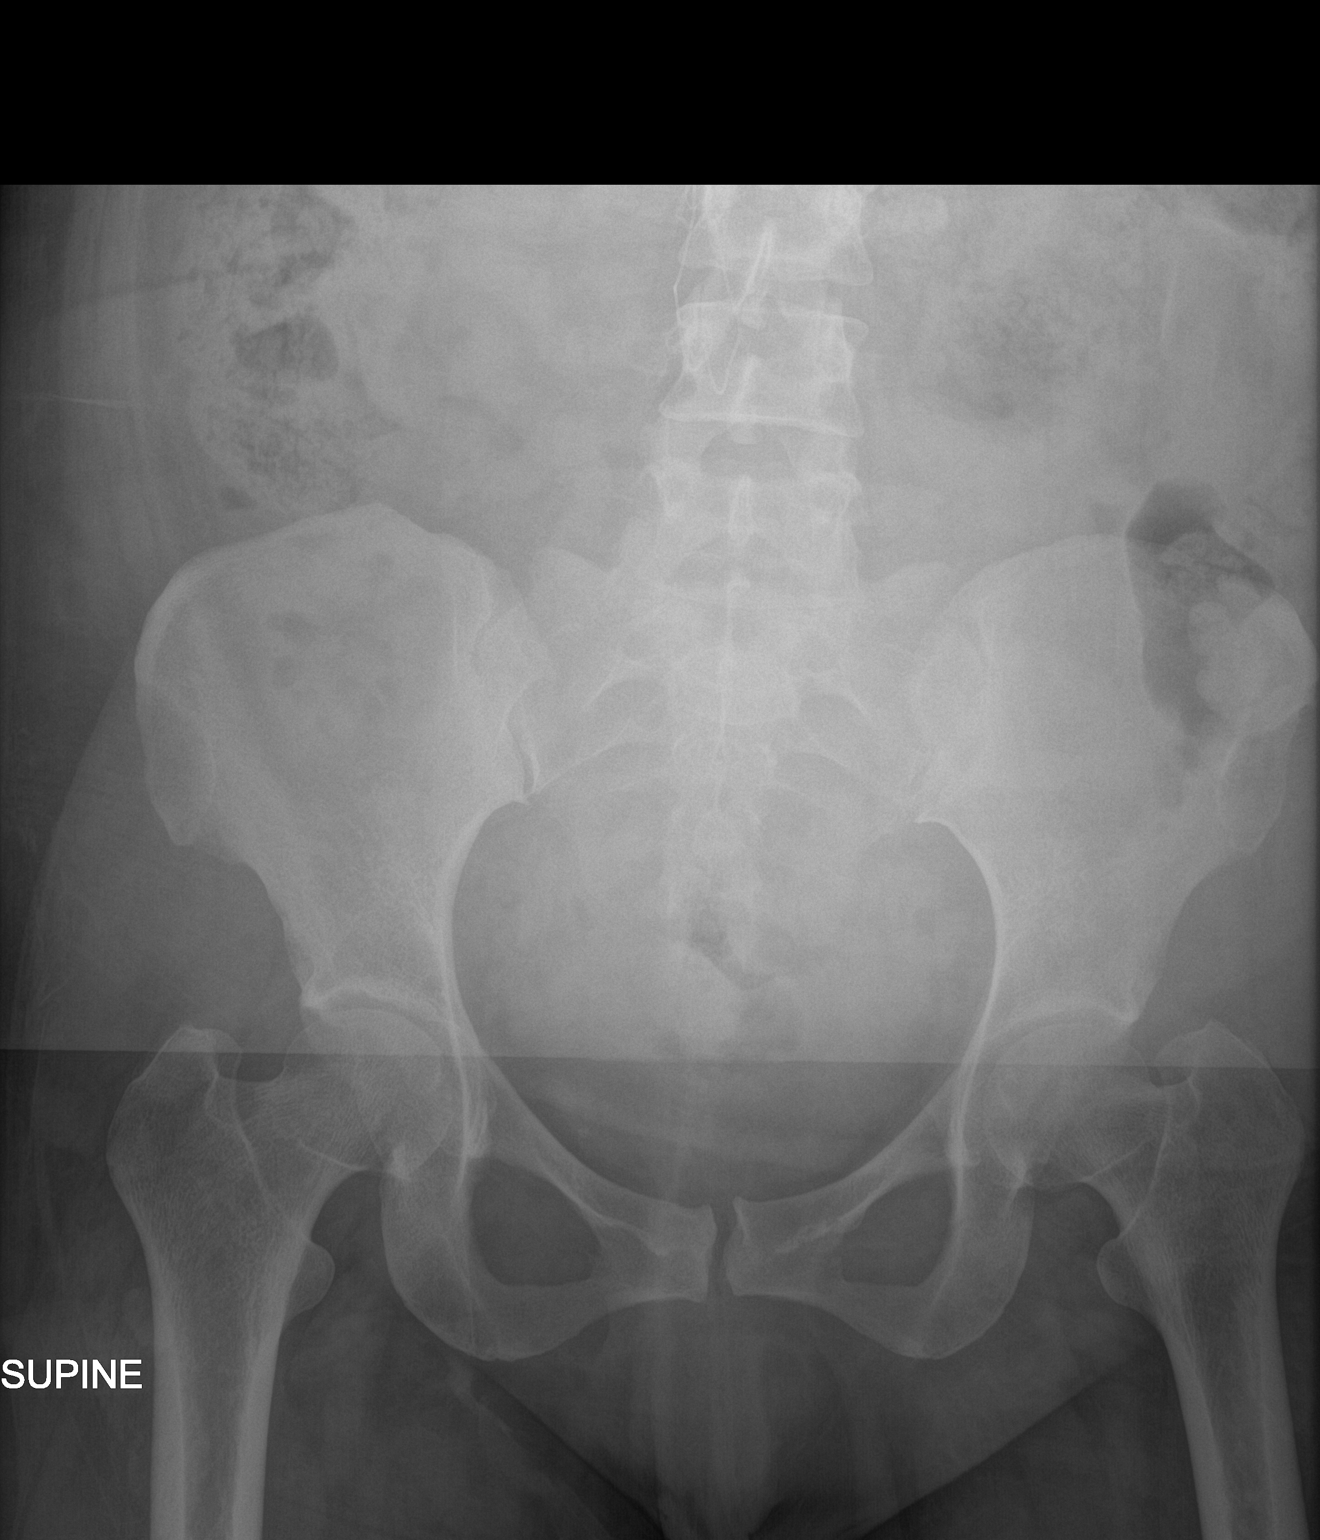

[1 of 1 positions shown; findings below may reference images not displayed]

FINDINGS: Long, linear radiopaque density projecting over the midline abdomen.
No additional radiopaque density identified. Nonobstructive bowel
gas pattern. No acute osseous abnormality.
IMPRESSION: 1. Long, linear radiopaque density projecting over the midline
abdomen, possibly an epidural catheter. Correlate clinically. No
additional abnormal radiopaque density seen.

## 2019-12-16 ENCOUNTER — Other Ambulatory Visit: Payer: Self-pay | Admitting: Primary Care

## 2019-12-16 ENCOUNTER — Other Ambulatory Visit: Payer: Self-pay | Admitting: *Deleted

## 2019-12-16 ENCOUNTER — Inpatient Hospital Stay
Admission: RE | Admit: 2019-12-16 | Discharge: 2019-12-16 | Disposition: A | Discharge status: Home / Self Care | Payer: Self-pay | Source: Ambulatory Visit | Attending: *Deleted | Admitting: *Deleted

## 2019-12-16 DIAGNOSIS — Z1231 Encounter for screening mammogram for malignant neoplasm of breast: Secondary | ICD-10-CM

## 2019-12-16 DIAGNOSIS — N6122 Granulomatous mastitis, left breast: Secondary | ICD-10-CM

## 2019-12-21 ENCOUNTER — Ambulatory Visit
Admission: RE | Admit: 2019-12-21 | Discharge: 2019-12-21 | Disposition: A | Discharge status: Home / Self Care | Payer: BC Managed Care – PPO | Source: Ambulatory Visit | Attending: Primary Care | Admitting: Primary Care

## 2019-12-21 DIAGNOSIS — N6122 Granulomatous mastitis, left breast: Secondary | ICD-10-CM | POA: Diagnosis present

## 2021-01-05 ENCOUNTER — Other Ambulatory Visit: Payer: Self-pay | Admitting: Primary Care

## 2021-01-24 ENCOUNTER — Other Ambulatory Visit: Payer: Self-pay | Admitting: Primary Care

## 2021-01-25 ENCOUNTER — Other Ambulatory Visit: Payer: Self-pay | Admitting: Primary Care

## 2021-01-25 DIAGNOSIS — N6122 Granulomatous mastitis, left breast: Secondary | ICD-10-CM

## 2021-01-25 DIAGNOSIS — N6019 Diffuse cystic mastopathy of unspecified breast: Secondary | ICD-10-CM

## 2021-03-03 ENCOUNTER — Ambulatory Visit
Admission: RE | Admit: 2021-03-03 | Discharge: 2021-03-03 | Disposition: A | Discharge status: Home / Self Care | Payer: BC Managed Care – PPO | Source: Ambulatory Visit | Attending: Primary Care | Admitting: Primary Care

## 2021-03-03 ENCOUNTER — Other Ambulatory Visit: Payer: Self-pay

## 2021-03-03 DIAGNOSIS — N6019 Diffuse cystic mastopathy of unspecified breast: Secondary | ICD-10-CM | POA: Insufficient documentation

## 2021-03-03 DIAGNOSIS — N6122 Granulomatous mastitis, left breast: Secondary | ICD-10-CM

## 2021-03-08 ENCOUNTER — Other Ambulatory Visit: Payer: Self-pay | Admitting: Primary Care

## 2021-03-08 DIAGNOSIS — R928 Other abnormal and inconclusive findings on diagnostic imaging of breast: Secondary | ICD-10-CM

## 2021-03-08 DIAGNOSIS — N6310 Unspecified lump in the right breast, unspecified quadrant: Secondary | ICD-10-CM

## 2021-03-13 ENCOUNTER — Ambulatory Visit
Admission: RE | Admit: 2021-03-13 | Discharge: 2021-03-13 | Disposition: A | Discharge status: Home / Self Care | Payer: BC Managed Care – PPO | Source: Ambulatory Visit | Attending: Primary Care | Admitting: Primary Care

## 2021-03-13 ENCOUNTER — Other Ambulatory Visit: Payer: Self-pay

## 2021-03-13 DIAGNOSIS — N6310 Unspecified lump in the right breast, unspecified quadrant: Secondary | ICD-10-CM | POA: Diagnosis present

## 2021-03-13 DIAGNOSIS — R928 Other abnormal and inconclusive findings on diagnostic imaging of breast: Secondary | ICD-10-CM | POA: Diagnosis not present

## 2021-03-13 HISTORY — PX: BREAST BIOPSY: SHX20

## 2021-03-16 LAB — SURGICAL PATHOLOGY

## 2021-04-28 ENCOUNTER — Other Ambulatory Visit: Payer: Self-pay

## 2021-04-28 ENCOUNTER — Encounter: Payer: Self-pay | Admitting: Emergency Medicine

## 2021-04-28 DIAGNOSIS — R197 Diarrhea, unspecified: Secondary | ICD-10-CM | POA: Diagnosis not present

## 2021-04-28 DIAGNOSIS — R112 Nausea with vomiting, unspecified: Secondary | ICD-10-CM | POA: Diagnosis not present

## 2021-04-28 DIAGNOSIS — R1084 Generalized abdominal pain: Secondary | ICD-10-CM | POA: Diagnosis not present

## 2021-04-28 DIAGNOSIS — R109 Unspecified abdominal pain: Secondary | ICD-10-CM | POA: Diagnosis present

## 2021-04-28 LAB — CBC
HCT: 45.2 % (ref 36.0–46.0)
Hemoglobin: 15.6 g/dL — ABNORMAL HIGH (ref 12.0–15.0)
MCH: 30.6 pg (ref 26.0–34.0)
MCHC: 34.5 g/dL (ref 30.0–36.0)
MCV: 88.6 fL (ref 80.0–100.0)
Platelets: 327 10*3/uL (ref 150–400)
RBC: 5.1 MIL/uL (ref 3.87–5.11)
RDW: 13 % (ref 11.5–15.5)
WBC: 23.2 10*3/uL — ABNORMAL HIGH (ref 4.0–10.5)
nRBC: 0 % (ref 0.0–0.2)

## 2021-04-28 LAB — COMPREHENSIVE METABOLIC PANEL
ALT: 26 U/L (ref 0–44)
AST: 33 U/L (ref 15–41)
Albumin: 4.5 g/dL (ref 3.5–5.0)
Alkaline Phosphatase: 59 U/L (ref 38–126)
Anion gap: 12 (ref 5–15)
BUN: 18 mg/dL (ref 6–20)
CO2: 24 mmol/L (ref 22–32)
Calcium: 9.7 mg/dL (ref 8.9–10.3)
Chloride: 102 mmol/L (ref 98–111)
Creatinine, Ser: 0.64 mg/dL (ref 0.44–1.00)
GFR, Estimated: >60 mL/min (ref >60)
Glucose, Bld: 170 mg/dL — ABNORMAL HIGH (ref 70–99)
Potassium: 3.9 mmol/L (ref 3.5–5.1)
Sodium: 138 mmol/L (ref 135–145)
Total Bilirubin: 0.8 mg/dL (ref 0.3–1.2)
Total Protein: 8 g/dL (ref 6.5–8.1)

## 2021-04-28 LAB — LIPASE, BLOOD: Lipase: 28 U/L (ref 11–51)

## 2021-04-28 NOTE — ED Notes (Signed)
Per ems pt drank a diet "liquid" per ems per ems pt with n/v/d for an hour after drinking liquid. Per ems vital signs: 124/61, 70, 99%ra, 97.2

## 2021-04-28 NOTE — ED Triage Notes (Signed)
Pt reports she took a cleanser and develop-ed abdominal pain since 6pm. Pt reports after she took cleanser she had one episode of emesis and several episodes of diarrhea. Pt talks in complete sentences no respiratory distress noted. Pt does not know the name of product she consume.

## 2021-04-28 NOTE — ED Notes (Signed)
No answer when called for triage, pt no longer visualized in lobby.

## 2021-04-29 ENCOUNTER — Emergency Department
Admission: EM | Admit: 2021-04-29 | Discharge: 2021-04-29 | Disposition: A | Discharge status: Home / Self Care | Payer: BC Managed Care – PPO | Attending: Emergency Medicine | Admitting: Emergency Medicine

## 2021-04-29 DIAGNOSIS — R1084 Generalized abdominal pain: Secondary | ICD-10-CM

## 2021-04-29 DIAGNOSIS — R197 Diarrhea, unspecified: Secondary | ICD-10-CM

## 2021-04-29 LAB — URINALYSIS, COMPLETE (UACMP) WITH MICROSCOPIC
Bacteria, UA: NONE SEEN
Bilirubin Urine: NEGATIVE
Glucose, UA: NEGATIVE mg/dL
Ketones, ur: NEGATIVE mg/dL
Leukocytes,Ua: NEGATIVE
Nitrite: NEGATIVE
Protein, ur: 100 mg/dL — AB
RBC / HPF: >50 RBC/hpf — ABNORMAL HIGH (ref 0–5)
Specific Gravity, Urine: 1.027 (ref 1.005–1.030)
pH: 6 (ref 5.0–8.0)

## 2021-04-29 NOTE — ED Notes (Signed)
Patient provided with crackers and ginger ale

## 2021-04-29 NOTE — ED Notes (Signed)
Pt updated on wait.  °

## 2021-04-29 NOTE — ED Provider Notes (Signed)
[facility] Emergency Department Provider Note ____________________________________________   Event Date/Time   First MD Initiated Contact with Patient 04/29/21 [phone]     (approximate)  I have reviewed the triage vital signs and the nursing notes.  HISTORY  Chief Complaint Abdominal Pain   HPI Monica Travis is a 40 y.o. femalewho presents to the ED for evaluation of abdominal pain.  Chart review indicates obesity.  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  Patient reports that she is currently on prednisone taper and doxycycline for her mastitis, and that the skin lesion looks a lot better and is no longer purulent.  Patient presents to the ED for evaluation of recurrent emesis, diarrhea and abdominal cramping and pain.  Patient reports that she ingested a "cleanse" liquid given to her by a friend to "help clean out her intestines."  When asked why, she reports that she thought she just needed to and that a lot of people do this.  Prior to taking this cleanse that she got from a friend, she reports that she felt fine.  She had no abdominal pain, emesis, diarrhea, fever.  And again reports that her breast lesion has been improving significantly looks a lot better.  She reports drinking this liquid the afternoon of 6/10, and about an hour later developed recurrent emesis, cramping abdominal pain and watery diarrhea.  Reports Episodes of nonbloody nonbilious emesis, 2-3 episodes of watery diarrhea.  She reports epigastric discomfort that is since resolved by the time that I see her.  She says she feels fine and is requesting discharge.  She reports feeling foolish and regrets taking this cleanse liquid.   Past Medical History:  Diagnosis Date   Allergy     Patient Active Problem List   Diagnosis Date Noted   GDM, class A2 11/22/2017    Past Surgical History:  Procedure Laterality Date   BREAST BIOPSY Right 03/13/2021   [place] bx, path pending,  vision marker   CESAREAN SECTION  11/22/2017   Procedure: CESAREAN SECTION;  Surgeon: Feliberto Gottron, Ihor Austin, MD;  Location: [facility] ORS;  Service: Obstetrics;;    Prior to Admission medications   Medication Sig Start Date End Date Taking? Authorizing Provider  acetaminophen (TYLENOL) 500 MG tablet Take 2 tablets (1,000 mg total) by mouth every 6 (six) hours. 11/25/17   McVey, Prudencio Pair, CNM  cetirizine (ZYRTEC) 10 MG tablet Take 10 mg by mouth daily.    [provider]  ibuprofen (ADVIL,MOTRIN) 600 MG tablet Take 1 tablet (600 mg total) by mouth every 6 (six) hours. 11/25/17   McVey, Prudencio Pair, CNM  oxyCODONE (OXY IR/ROXICODONE) 5 MG immediate release tablet Take 1 tablet (5 mg total) by mouth every 4 (four) hours as needed (pain scale 4-7). 11/25/17   McVey, Prudencio Pair, CNM  Prenatal Vit-Fe Fumarate-FA (PRENATAL MULTIVITAMIN) TABS tablet Take 1 tablet by mouth daily at 12 noon.    [provider]  senna-docusate (SENOKOT-S) 8.6-50 MG tablet Take 2 tablets by mouth daily. 11/25/17   McVey, Prudencio Pair, CNM  simethicone (MYLICON) 80 MG chewable tablet Chew 2 tablets (160 mg total) by mouth 4 (four) times daily as needed for flatulence. 11/25/17   McVey, Prudencio Pair, CNM    Allergies Patient has no known allergies.  Family History  Problem Relation Age of Onset   Breast cancer Paternal Aunt     Social History Social History   Tobacco Use   Smoking status: Never   Smokeless tobacco:  Never  Substance Use Topics   Alcohol use: No    Review of Systems  Constitutional: No fever/chills Eyes: No visual changes. ENT: No sore throat. Cardiovascular: Denies chest pain. Respiratory: Denies shortness of breath. Gastrointestinal: Positive for abdominal pain, nausea, vomiting and diarrhea.  No constipation. Genitourinary: Negative for dysuria. Musculoskeletal: Negative for back pain. Skin: Negative for rash. Neurological: Negative for headaches, focal weakness or  numbness.  ____________________________________________   PHYSICAL EXAM:  VITAL SIGNS: Vitals:   04/29/21 0322 04/29/21 0502  BP: 127/74 (!) 132/92  Pulse: 79 87  Resp: 18 16  Temp: 98.3 F (36.8 C)   SpO2: 97% 100%    Constitutional: Alert and oriented. Well appearing and in no acute distress. Eyes: Conjunctivae are normal. PERRL. EOMI. Head: Atraumatic. Nose: No congestion/rhinnorhea. Mouth/Throat: Mucous membranes are moist.  Oropharynx non-erythematous. Neck: No stridor. No cervical spine tenderness to palpation. Cardiovascular: Normal rate, regular rhythm. Grossly normal heart sounds.  Good peripheral circulation. Respiratory: Normal respiratory effort.  No retractions. Lungs CTAB. Gastrointestinal: Soft , nondistended, nontender to palpation. No CVA tenderness.  Benign throughout Musculoskeletal: No lower extremity tenderness nor edema.  No joint effusions. No signs of acute trauma. Neurologic:  Normal speech and language. No gross focal neurologic deficits are appreciated. No gait instability noted. Skin:  Skin is warm, dry and intact.  Breast exam demonstrates small, 5 mm, area of skin discoloration that is flat to the 7 o'clock position, just about 1 or 2 cm beyond the areola on the right breast.  No fluctuance, purulence or induration.  Patient reports the site looks much better than previous, where it was draining purulent material. Psychiatric: Mood and affect are normal. Speech and behavior are normal. ____________________________________________   LABS (all labs ordered are listed, but only abnormal results are displayed)  Labs Reviewed  COMPREHENSIVE METABOLIC PANEL - Abnormal; Notable for the following components:      Result Value   Glucose, Bld 170 (*)    All other components within normal limits  CBC - Abnormal; Notable for the following components:   WBC 23.2 (*)    Hemoglobin 15.6 (*)    All other components within normal limits  URINALYSIS, COMPLETE  (UACMP) WITH MICROSCOPIC - Abnormal; Notable for the following components:   Color, Urine YELLOW (*)    APPearance CLOUDY (*)    Hgb urine dipstick LARGE (*)    Protein, ur 100 (*)    RBC / HPF >50 (*)    All other components within normal limits  LIPASE, BLOOD  POC URINE PREG, ED   ____________________________________________  12 Lead EKG   ____________________________________________  RADIOLOGY  ED MD interpretation:    Official radiology report(s): No results found.  ____________________________________________   PROCEDURES and INTERVENTIONS  Procedure(s) performed (including Critical Care):  .1-3 Lead EKG Interpretation  Date/Time: 04/29/2021 5:40 AM Performed by: Delton Prairie, MD Authorized by: Delton Prairie, MD     Interpretation: normal     ECG rate:  80   ECG rate assessment: normal     Rhythm: sinus rhythm     Ectopy: none     Conduction: normal    Medications - No data to display  ____________________________________________   MDM / ED COURSE   Patient presents to the ED for evaluation of abdominal pain and cramping, nausea, vomiting and diarrhea, likely due to this cleanse liquid that she took, with resolving symptoms for the time that I see here and ultimately amenable to outpatient management.  Normal vitals  on room air.  Exam reassuring without evidence of acute derangements.  Mucous membranes are moist and she has no evidence of significant dehydration on exam.  Her abdomen is benign without evidence of tenderness or pain.  She reports that she feels fine and has resolution of her presenting symptoms.  Blood work does show leukocytosis with neutrophilia, as well as polycythemia.  I suspect her leukocytosis is due to relative volume depletion and hemoconcentration as well as her steroid use over the past couple weeks.  Exam is further reassuring regarding her improving breast lesion.  She is tolerating p.o. intake and looks clinically well.  No fever,  tachycardia or further symptoms to suggest sepsis.  We will discharge with return precautions.  Clinical Course as of 04/29/21 0552  Sat Apr 29, 2021  0444 Patient is requesting discharge.  I discussed with her her work-up with elevated WBCs.  We discussed possible etiologies of this, such as relative hemoconcentration in addition to physiologic stress and being on prednisone.  She is requesting discharge and indicates that she feels fine and just wants to go home.  Shared decision-making yields plan to perform p.o. challenge to ensure adequate p.o. intake before likely outpatient management. [DS]    Clinical Course User Index [DS] Delton Prairie, MD    ____________________________________________   FINAL CLINICAL IMPRESSION(S) / ED DIAGNOSES  Final diagnoses:  Generalized abdominal pain  Nausea vomiting and diarrhea     ED Discharge Orders     None        Philis Doke Katrinka Blazing   Note:  This document was prepared using Dragon voice recognition software and may include unintentional dictation errors.    Delton Prairie, MD 04/29/21 [id number]

## 2021-04-29 NOTE — ED Notes (Signed)
Patient passed PO challenge without nausea or vomiting.

## 2022-03-27 HISTORY — PX: BREAST BIOPSY: SHX20

## 2022-05-01 IMAGING — MG MM BREAST LOCALIZATION CLIP
4 series · 4 of 12 positions shown · non-contrast
Comparison: Previous exam(s).

CLINICAL DATA: Evaluate post biopsy marker clip placement following
ultrasound-guided core needle biopsy of the right breast.

EXAM:
DIAGNOSTIC RIGHT MAMMOGRAM POST ULTRASOUND BIOPSY

[R ML synth-2D]
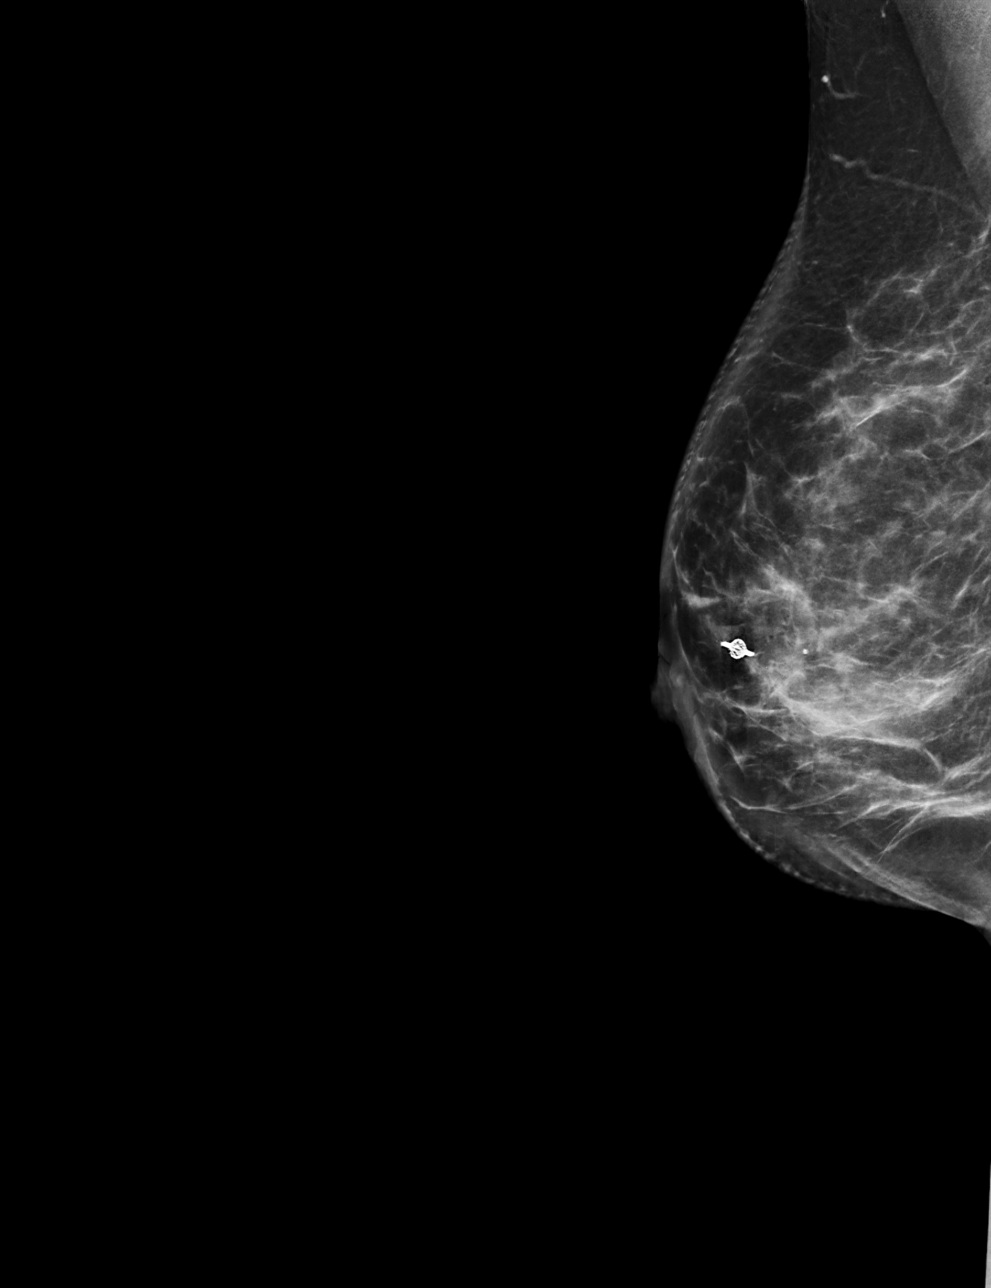

[R CC synth-2D]
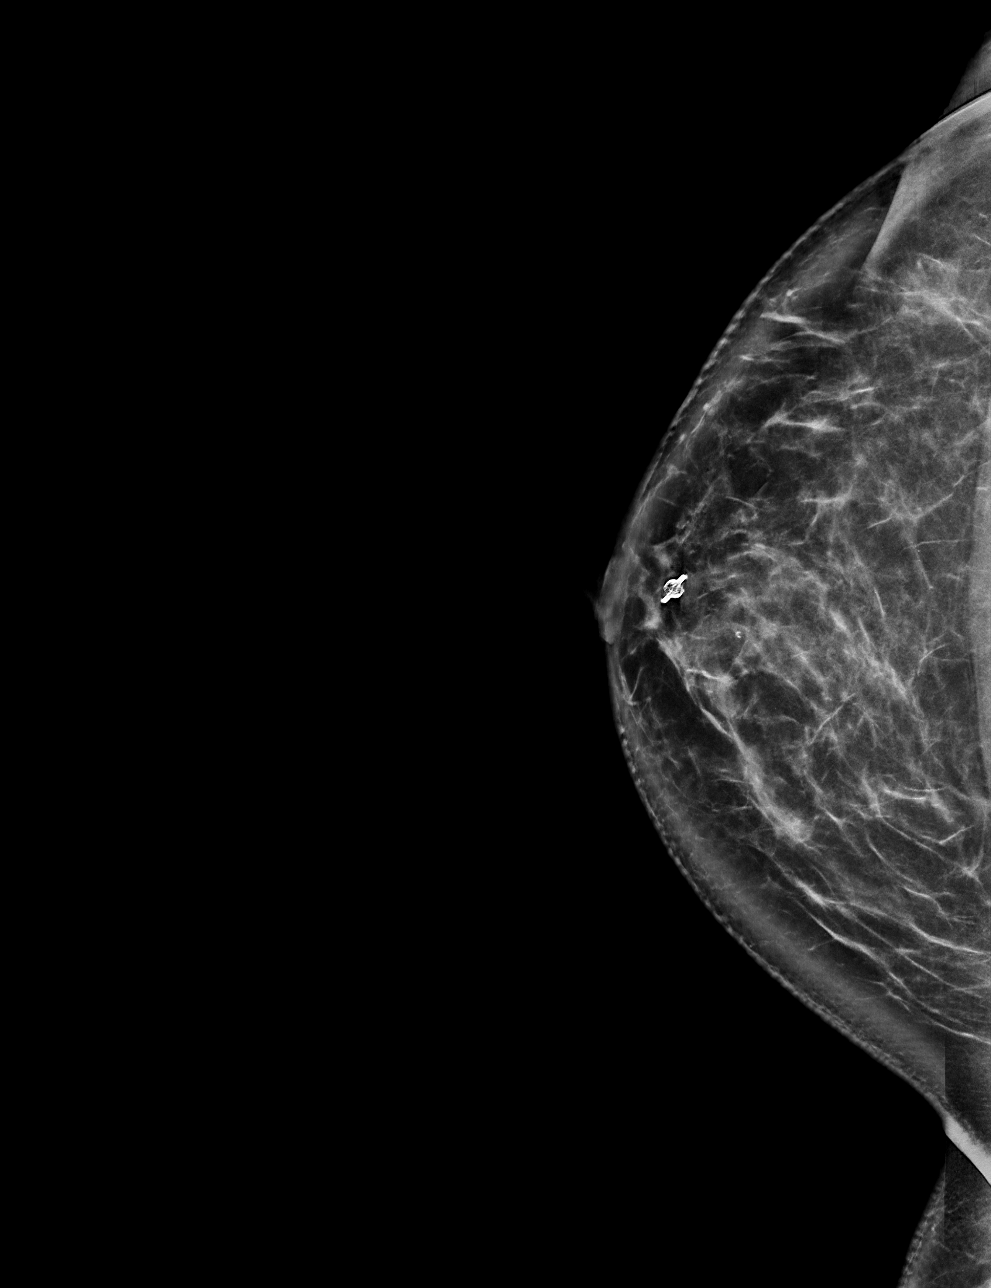

[R ML tomo · tomo slice 45/89.0]
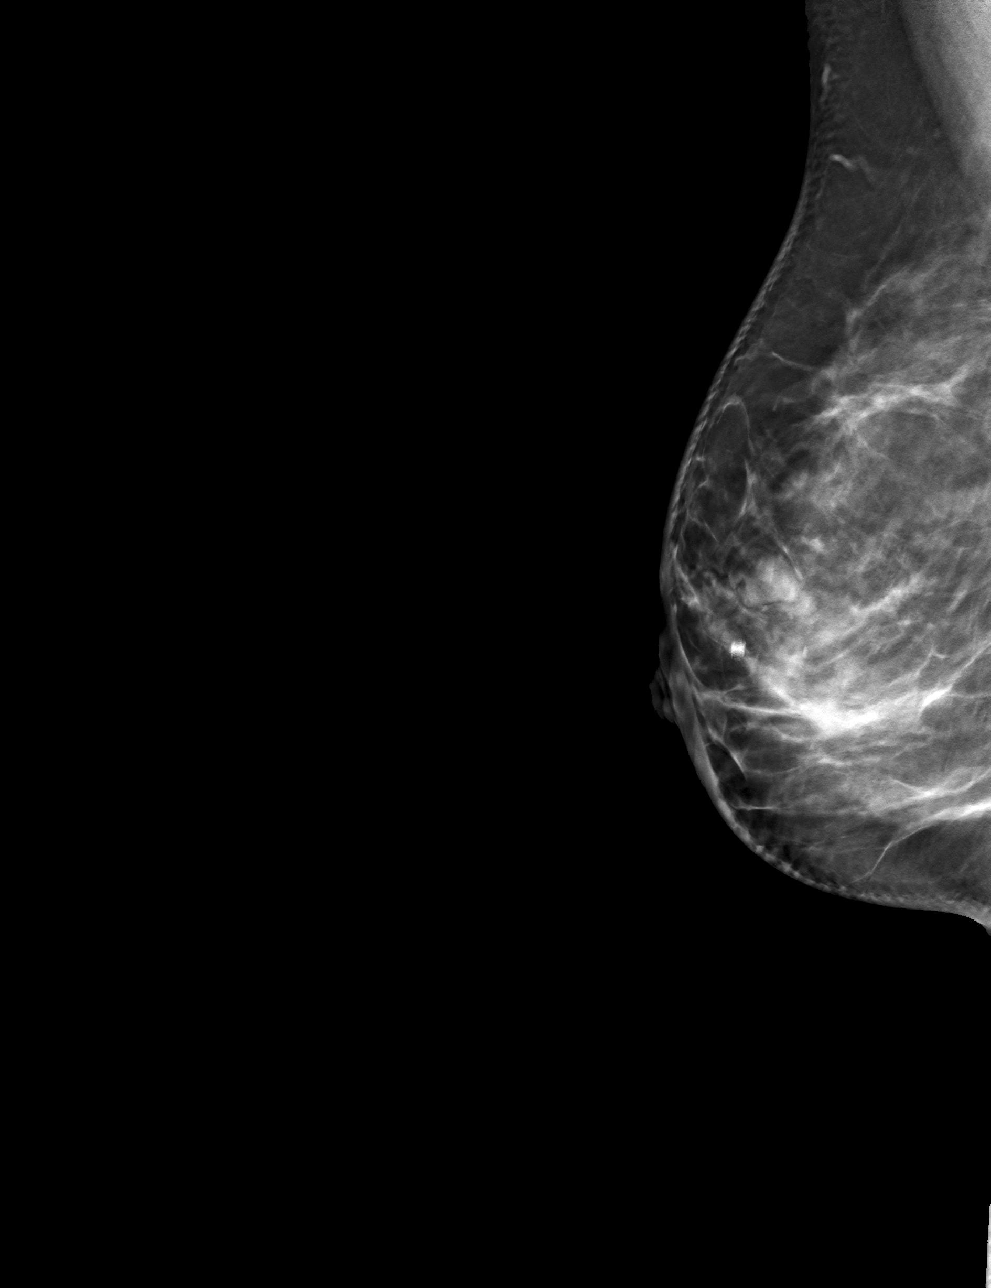

[R CC tomo · tomo slice 43/86.0]
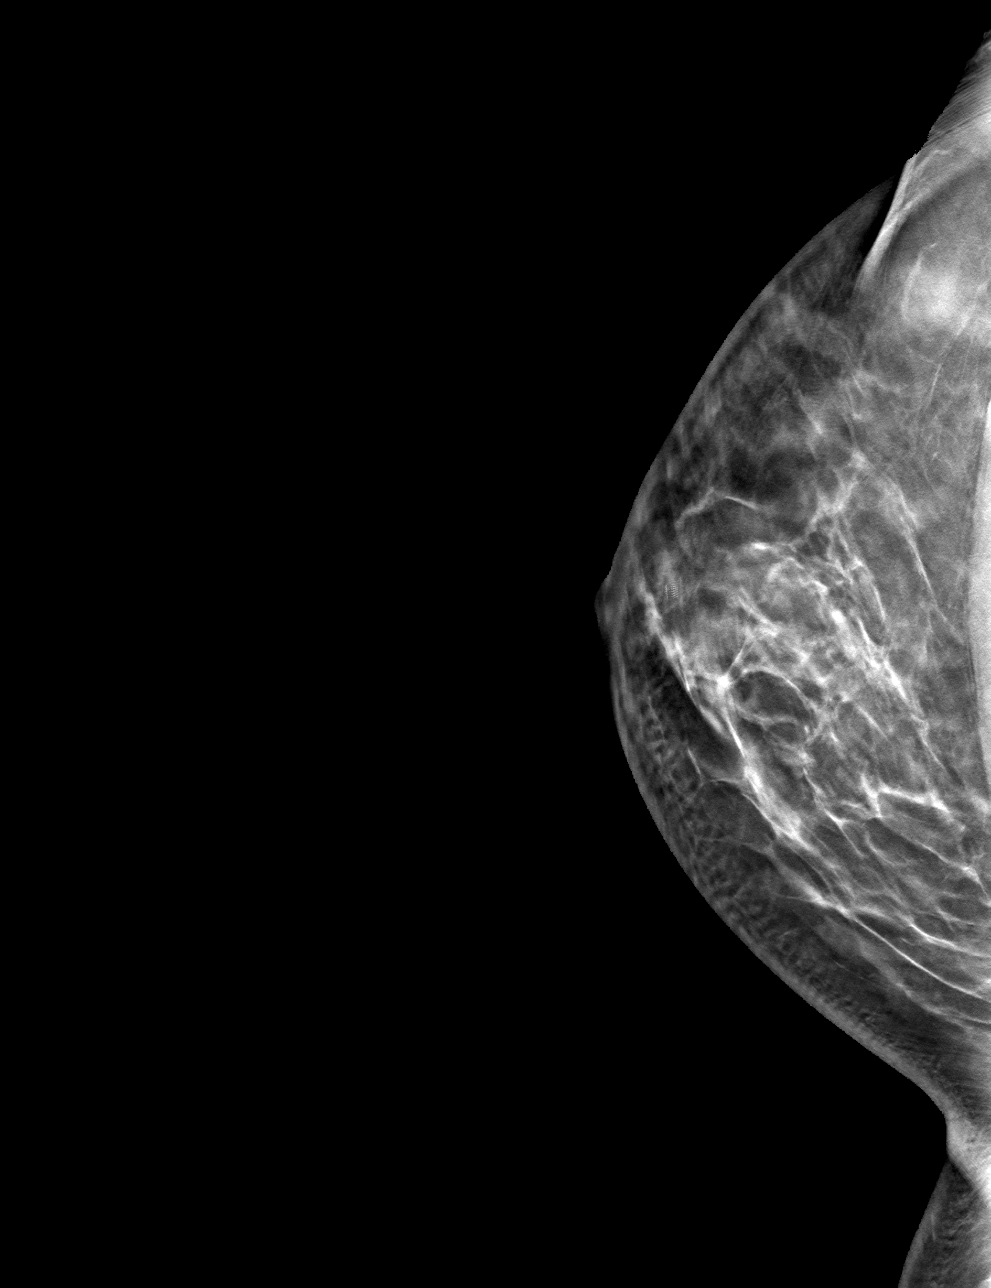

[4 of 12 positions shown; findings below may reference images not displayed]

FINDINGS: Mammographic images were obtained following ultrasound guided biopsy
of a retroareolar right breast mass. The biopsy marking clip is in
expected position at the site of biopsy.
IMPRESSION: Appropriate positioning of the vision shaped biopsy marking clip at
the site of biopsy in the 12 o'clock retroareolar right breast.

Final Assessment: Post Procedure Mammograms for Marker Placement

## 2022-06-04 ENCOUNTER — Other Ambulatory Visit: Payer: Self-pay

## 2022-06-04 DIAGNOSIS — Z1231 Encounter for screening mammogram for malignant neoplasm of breast: Secondary | ICD-10-CM

## 2022-06-05 ENCOUNTER — Other Ambulatory Visit: Payer: Self-pay

## 2022-06-05 ENCOUNTER — Ambulatory Visit: Payer: Self-pay

## 2022-06-05 ENCOUNTER — Ambulatory Visit: Payer: Self-pay | Attending: Hematology and Oncology | Admitting: *Deleted

## 2022-06-05 VITALS — BP 125/82 | Wt 158.9 lb

## 2022-06-05 DIAGNOSIS — N6311 Unspecified lump in the right breast, upper outer quadrant: Secondary | ICD-10-CM

## 2022-06-05 DIAGNOSIS — Z0000 Encounter for general adult medical examination without abnormal findings: Secondary | ICD-10-CM

## 2022-06-05 DIAGNOSIS — Z124 Encounter for screening for malignant neoplasm of cervix: Secondary | ICD-10-CM

## 2022-06-05 NOTE — Progress Notes (Signed)
Monica Travis is a 41 y.o. [phone] female who presents to [facility] clinic today with complaint of a right breast mass.  Patient with a history of chronic mastitis.  She is followed at [facility] by Rheumatology and surgery for granulamatous mastitis.  She is currently taking Methotrexate for the mastitis.  She states she has a new lump in the same breast.   Pap Smear: Pap smear completed today. Last Pap smear was 05/15/18 at [facility] clinic and was  negative without HPV co-testing . Per patient has no history of an abnormal Pap smear. Last Pap smear result is available in Epic.   Physical exam: Breasts Physical Exam Chest:  Breasts:    Right: Mass and skin change present. No swelling, bleeding, inverted nipple, nipple discharge or tenderness.     Left: No swelling, bleeding, inverted nipple, mass, nipple discharge, skin change or tenderness.     Abdominal:     Palpations: There is no hepatomegaly or splenomegaly.  Genitourinary:    Exam position: Lithotomy position.     Labia:        Right: No rash, tenderness, lesion or injury.        Left: No rash, tenderness, lesion or injury.      Urethra: No prolapse or urethral pain.     Vagina: No signs of injury and foreign body. No vaginal discharge, erythema, tenderness, bleeding, lesions or prolapsed vaginal walls.     Cervix: No cervical motion tenderness, discharge, friability, lesion, erythema or cervical bleeding.     Uterus: Not deviated and not enlarged.      Adnexa:        Right: No mass.         Left: No mass.       Rectum: Guaiac result negative. No mass.       Lymphadenopathy:     Upper Body:     Right upper body: No supraclavicular or axillary adenopathy.     Left upper body: No supraclavicular or axillary adenopathy.        Smoking History: Patient has never smoked    Patient Navigation: Patient education provided. Access to services provided for patient through [facility] program. No interpreter provided. Patient spoke  [place].   No transportation provided   Colorectal Cancer Screening: Per patient has never had colonoscopy completed. No complaints today. Patient does not meet screening guidelines due to age.   Breast and Cervical Cancer Risk Assessment: Patient has family history of breast cancer in a paternal aunt, no known genetic mutations, or radiation treatment to the chest before age 64. Patient does not have history of cervical dysplasia, immunocompromised, or DES exposure in-utero.  Risk Assessment     Risk Scores       06/05/2022   Last edited by: Narda Rutherford, LPN   5-year risk: 0.5 %   Lifetime risk: 6.8 %            A: [facility] exam with pap smear Complaint of right breast mass  P: Referred patient to the [facility] for a diagnostic mammogram. Appointment scheduled 06/12/22 @ 1:40.  Patient was given her appointment date and time.  Will follow up per [facility] protocol.  Jim Like, RN 06/05/2022 11:08 AM

## 2022-06-05 NOTE — Progress Notes (Deleted)
  Subjective:     Patient ID: Monica Travis, female   DOB: 11/24/1980, 41 y.o.   MRN: [id number]  HPI   Review of Systems     Objective:   Physical Exam Chest:  Breasts:    Right: Mass and skin change present. No swelling, bleeding, inverted nipple, nipple discharge or tenderness.     Left: No swelling, bleeding, inverted nipple, mass, nipple discharge, skin change or tenderness.    Abdominal:     Palpations: There is no hepatomegaly or splenomegaly.  Genitourinary:    Exam position: Lithotomy position.     Labia:        Right: No rash, tenderness, lesion or injury.        Left: No rash, tenderness, lesion or injury.      Urethra: No prolapse or urethral pain.     Vagina: No signs of injury and foreign body. No vaginal discharge, erythema, tenderness, bleeding, lesions or prolapsed vaginal walls.     Cervix: No cervical motion tenderness, discharge, friability, lesion, erythema or cervical bleeding.     Uterus: Not deviated and not enlarged.      Adnexa:        Right: No mass.         Left: No mass.       Rectum: Guaiac result negative. No mass.     Lymphadenopathy:     Upper Body:     Right upper body: No supraclavicular or axillary adenopathy.     Left upper body: No supraclavicular or axillary adenopathy.      Assessment:     ***    Plan:     ***

## 2022-06-08 LAB — CYTOLOGY - PAP
Comment: NEGATIVE
Comment: NEGATIVE
Comment: NEGATIVE
Diagnosis: NEGATIVE
HPV 16: NEGATIVE
HPV 18 / 45: POSITIVE — AB
High risk HPV: POSITIVE — AB

## 2022-06-08 NOTE — Progress Notes (Signed)
HPV typing added per Tamika @ Cytology.

## 2022-06-11 ENCOUNTER — Telehealth: Payer: Self-pay

## 2022-06-11 NOTE — Telephone Encounter (Signed)
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 Discussed with patient. Patient verbalized understanding. Patient scheduled with [facility], July 24, 2022 @ 10:00 am/Dr. Valentino Saxon.

## 2022-06-12 ENCOUNTER — Ambulatory Visit
Admission: RE | Admit: 2022-06-12 | Discharge: 2022-06-12 | Disposition: A | Discharge status: Home / Self Care | Payer: Self-pay | Source: Ambulatory Visit | Attending: Obstetrics and Gynecology | Admitting: Obstetrics and Gynecology

## 2022-06-12 DIAGNOSIS — N6311 Unspecified lump in the right breast, upper outer quadrant: Secondary | ICD-10-CM

## 2022-06-13 ENCOUNTER — Other Ambulatory Visit: Payer: Self-pay | Admitting: Obstetrics and Gynecology

## 2022-06-13 DIAGNOSIS — N63 Unspecified lump in unspecified breast: Secondary | ICD-10-CM

## 2022-06-13 DIAGNOSIS — R928 Other abnormal and inconclusive findings on diagnostic imaging of breast: Secondary | ICD-10-CM

## 2022-06-13 DIAGNOSIS — R921 Mammographic calcification found on diagnostic imaging of breast: Secondary | ICD-10-CM

## 2022-06-27 ENCOUNTER — Ambulatory Visit
Admission: RE | Admit: 2022-06-27 | Discharge: 2022-06-27 | Disposition: A | Discharge status: Home / Self Care | Payer: Self-pay | Source: Ambulatory Visit | Attending: Obstetrics and Gynecology | Admitting: Obstetrics and Gynecology

## 2022-06-27 DIAGNOSIS — R921 Mammographic calcification found on diagnostic imaging of breast: Secondary | ICD-10-CM | POA: Insufficient documentation

## 2022-06-27 DIAGNOSIS — R928 Other abnormal and inconclusive findings on diagnostic imaging of breast: Secondary | ICD-10-CM

## 2022-06-27 DIAGNOSIS — N63 Unspecified lump in unspecified breast: Secondary | ICD-10-CM | POA: Insufficient documentation

## 2022-06-27 HISTORY — PX: BREAST BIOPSY: SHX20

## 2022-07-03 LAB — SURGICAL PATHOLOGY

## 2022-07-20 NOTE — Progress Notes (Unsigned)
    GYNECOLOGY OFFICE COLPOSCOPY PROCEDURE NOTE  41 y.o. [id number] here for colposcopy for {Findings; lab pap smear results:16707::"no abnormalities"} pap smear on ***. Discussed role for HPV in cervical dysplasia, need for surveillance.  Patient gave informed written consent, time out was performed.  Placed in lithotomy position. Cervix viewed with speculum and colposcope after application of acetic acid.   Colposcopy adequate? {yes/no:20286}  {Findings; colposcopy:728}; corresponding biopsies obtained.  ECC specimen obtained. All specimens were labeled and sent to pathology.  Chaperone was present during entire procedure.  Patient was given post procedure instructions.  Will follow up pathology and manage accordingly; patient will be contacted with results and recommendations.  Routine preventative health maintenance measures emphasized.    Hildred Laser, MD [facility]

## 2022-07-24 ENCOUNTER — Encounter: Payer: Self-pay | Admitting: Obstetrics and Gynecology

## 2022-07-24 ENCOUNTER — Ambulatory Visit (INDEPENDENT_AMBULATORY_CARE_PROVIDER_SITE_OTHER): Payer: Self-pay | Admitting: Obstetrics and Gynecology

## 2022-07-24 ENCOUNTER — Other Ambulatory Visit (HOSPITAL_COMMUNITY)
Admission: RE | Admit: 2022-07-24 | Discharge: 2022-07-24 | Disposition: A | Discharge status: Home / Self Care | Payer: Self-pay | Source: Ambulatory Visit | Attending: Obstetrics and Gynecology | Admitting: Obstetrics and Gynecology

## 2022-07-24 VITALS — BP 140/77 | HR 84 | Resp 16 | Ht <= 58 in | Wt 155.8 lb

## 2022-07-24 DIAGNOSIS — R87810 Cervical high risk human papillomavirus (HPV) DNA test positive: Secondary | ICD-10-CM

## 2022-07-24 NOTE — Patient Instructions (Signed)
Colposcopa, cuidados posteriores Colposcopy, Care After  La siguiente informacin ofrece orientacin sobre cmo cuidarse despus del procedimiento. El mdico tambin podr darle instrucciones ms especficas. Comunquese con el mdico si tiene problemas o preguntas. Qu puedo esperar despus del procedimiento? Si le realizaron una colposcopa sin biopsia, puede esperar sentirse bien inmediatamente despus del procedimiento. Sin embargo, es posible que tenga algo de manchado de sangre durante algunos das. Puede retomar sus actividades normales. Si se le realiz una colposcopa con biopsia, despus del procedimiento es frecuente que presente lo siguiente: Sensibilidad y dolor leve. Esto puede durar algunos das. Sangrado leve de la vagina o secrecin de color oscuro y granulada. Esto puede durar algunos das. La secrecin puede deberse a un lquido (solucin) que se us durante el procedimiento. Durante este tiempo deber usar un apsito sanitario. Manchas de sangre durante al menos 48 horas despus del procedimiento. Siga estas instrucciones en su casa: Medicamentos Use los medicamentos de venta libre y los recetados solamente como se lo haya indicado el mdico. Hable con el mdico acerca de qu tipo de analgsico de venta libre y recetado puede volver a tomar. Es especialmente importante que hable con el mdico si toma anticoagulantes. Actividad Evite usar productos de ducha vaginal, usar tampones y tener sexo durante al menos 3 das despus del procedimiento o durante el tiempo que le haya indicado el mdico. Retome sus actividades normales como se lo haya indicado el mdico. Pregntele al mdico qu actividades son seguras para usted. Instrucciones generales Pregunte al mdico si puede tomar baos de inmersin, nadar o usar el jacuzzi. Puede ducharse. Si usa un mtodo anticonceptivo (anticoncepcin), contine utilizndolo. Concurra a todas las visitas de seguimiento. Esto es  importante. Comunquese con un mdico si: Tiene fiebre o escalofros. Se desmaya o tiene sensacin de desvanecimiento. Solicite ayuda de inmediato si: Tiene una hemorragia abundante por la vagina o despide cogulos de sangre. Sangrado abundante es el sangrado que empapa una toalla higinica en menos de 1 hora. Tiene secrecin vaginal que es anormal, tiene color amarillo o tiene mal olor. Puede ser un signo de infeccin. Tiene dolor intenso o clicos en la parte baja del abdomen que no se van con medicamentos. Resumen Si se le realiz una colposcopa sin biopsia, puede esperar sentirse bien de inmediato, pero es posible que presente manchas de sangre por algunos das. Puede retomar sus actividades normales. Si se le realiz una colposcopa con biopsia, es comn tener un dolor leve durante algunos das y manchas de sangre durante 48 horas despus del procedimiento. Evite usar productos de ducha vaginal, usar tampones y tener sexo durante al menos 3 das despus del procedimiento o durante el tiempo que le haya indicado el mdico. Busque ayuda de inmediato si tiene sangrado profuso, dolor intenso o signos de infeccin. Esta informacin no tiene como fin reemplazar el consejo del mdico. Asegrese de hacerle al mdico cualquier pregunta que tenga. Document Revised: 04/25/2021 Document Reviewed: 04/25/2021 Elsevier Patient Education  2023 [employer]  

## 2022-07-25 LAB — SURGICAL PATHOLOGY

## 2022-07-30 ENCOUNTER — Encounter: Payer: Self-pay | Admitting: Obstetrics and Gynecology

## 2022-11-21 ENCOUNTER — Other Ambulatory Visit: Payer: Self-pay

## 2022-11-21 DIAGNOSIS — N6311 Unspecified lump in the right breast, upper outer quadrant: Secondary | ICD-10-CM

## 2022-11-21 NOTE — Addendum Note (Signed)
Addended by: Demetrius Revel on: 11/21/2022 02:33 PM   Modules accepted: Orders

## 2022-12-31 ENCOUNTER — Ambulatory Visit
Admission: RE | Admit: 2022-12-31 | Discharge: 2022-12-31 | Disposition: A | Discharge status: Home / Self Care | Payer: Self-pay | Source: Ambulatory Visit | Attending: Obstetrics and Gynecology | Admitting: Obstetrics and Gynecology

## 2022-12-31 DIAGNOSIS — N6311 Unspecified lump in the right breast, upper outer quadrant: Secondary | ICD-10-CM

## 2023-04-09 ENCOUNTER — Other Ambulatory Visit: Payer: Self-pay

## 2023-04-09 DIAGNOSIS — Z1231 Encounter for screening mammogram for malignant neoplasm of breast: Secondary | ICD-10-CM

## 2023-06-17 ENCOUNTER — Other Ambulatory Visit: Payer: Self-pay

## 2023-06-17 ENCOUNTER — Ambulatory Visit: Payer: Self-pay | Attending: Hematology and Oncology | Admitting: Hematology and Oncology

## 2023-06-17 ENCOUNTER — Ambulatory Visit
Admission: RE | Admit: 2023-06-17 | Discharge: 2023-06-17 | Disposition: A | Discharge status: Home / Self Care | Payer: Self-pay | Source: Ambulatory Visit | Attending: Obstetrics and Gynecology | Admitting: Obstetrics and Gynecology

## 2023-06-17 VITALS — BP 128/78 | Wt 152.6 lb

## 2023-06-17 DIAGNOSIS — Z1231 Encounter for screening mammogram for malignant neoplasm of breast: Secondary | ICD-10-CM

## 2023-06-17 DIAGNOSIS — Z124 Encounter for screening for malignant neoplasm of cervix: Secondary | ICD-10-CM

## 2023-06-17 NOTE — Progress Notes (Signed)
Monica Travis is a 42 y.o. [phone] female who presents to [facility] clinic today with complaint of.    Pap Smear: Pap smear completed today. Last Pap smear was 06/05/2022 at [facility] clinic and was normal. HPV+. Per patient has no history of an abnormal Pap smear. Last Pap smear result is available in Epic.   Physical exam: Breasts Breasts symmetrical. No skin abnormalities bilateral breasts. No nipple retraction bilateral breasts. No nipple discharge bilateral breasts. No lymphadenopathy. No lumps palpated bilateral breasts.   MS DIGITAL DIAG TOMO UNI RIGHT  Result Date: 12/31/2022 CLINICAL DATA:  42 year old female presenting for six-month follow-up for an area of architectural distortion in the inner right breast. 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 This was worked up on June 12, 2022 and favored to represent scar, with short-term follow-up recommended. Notably, she is also status post stereotactic guided biopsy of a mass in the outer right breast and calcifications in the anterior left breast in August 2023, both with benign pathology. The patient denies any symptoms on today's examination. EXAM: DIGITAL DIAGNOSTIC UNILATERAL RIGHT MAMMOGRAM WITH TOMOSYNTHESIS; ULTRASOUND RIGHT BREAST LIMITED TECHNIQUE: Right digital diagnostic mammography and breast tomosynthesis was performed.; Targeted ultrasound examination of the right breast was performed COMPARISON:  Previous exam(s). ACR Breast Density Category c: The breasts are heterogeneously dense, which may obscure small masses. FINDINGS: Diagnostic views of the right breast demonstrate a persistent area of architectural distortion in the upper inner right breast anterior depth (spot MLO image 35/56). Notably, this is subjacent to the scar marker. Additionally, the previously seen associated asymmetry is now resolved. No new suspicious mass,  calcifications, or other findings in the right breast. Targeted right breast ultrasound was performed. At 2 o'clock 3 cm from the nipple, there is a subtle architectural distortion, immediately subjacent to visible scar on physical exam. The previously seen hypoechoic mass is now resolved. No suspicious solid or cystic mass or area of shadowing identified. This corresponds with the area of distortion with resolved asymmetry seen on mammogram and is consistent with benign scar. IMPRESSION: The area of architectural distortion in the upper inner right breast is consistent with benign scar. No evidence of malignancy in the right breast. RECOMMENDATION: Return to routine screening mammography is recommended. The patient will be due for screening in July 2024. I have discussed the findings and recommendations with the patient. If applicable, a reminder letter will be sent to the patient regarding the next appointment. BI-RADS CATEGORY  2: Benign. Electronically Signed   By: Jacob Moores M.D.   On: 12/31/2022 11:37  MM LT BREAST BX W LOC DEV 1ST LESION IMAGE BX SPEC STEREO GUIDE  Addendum Date: 06/28/2022   ADDENDUM REPORT: 06/28/2022 13:52 ADDENDUM: PATHOLOGY revealed: Site A. BREAST, LEFT, ANTERIOR; CORE BIOPSIES: - NO EVIDENCE OF SIGNIFICANT ATYPIA OR MALIGNANCY. - BENIGN BREAST PARENCHYMA WITH SCATTERED CALCIFICATIONS. Pathology results are CONCORDANT with imaging findings, per Dr. Sherian Rein. PATHOLOGY revealed: Site B. BREAST, RIGHT, LATERAL, MASS; CORE BIOPSIES: - NO EVIDENCE OF SIGNIFICANT ATYPIA OR MALIGNANCY. - GRANULOMATOUS INFLAMMATION PRESENT. Pathology results are CONCORDANT with imaging findings, per Dr. Sherian Rein. Pathology results and recommendations below were discussed with patient via [facility] Interpreter [phone] by telephone on 06/28/2022. Patient reported biopsy site within normal limits with slight tenderness at the site. Post biopsy care instructions were reviewed, questions were  answered and my direct phone number was provided to  patient. Patient was instructed to call [facility] if any concerns or questions arise related to the biopsy. RECOMMENDATION: Patient instructed to return in six months for unilateral RIGHT breast diagnostic mammogram to follow-up on RIGHT breast medial area of distortion per Dr. Mayford Knife. Patient informed a reminder notice will be sent regarding this appointment and she will need to call mammography site to schedule this appointment. Pathology results reported by Randa Lynn RN on 06/28/2022. Electronically Signed   By: Sherian Rein M.D.   On: 06/28/2022 13:52   Result Date: 06/28/2022 CLINICAL DATA:  Left breast calcifications for biopsy. EXAM: Left BREAST STEREOTACTIC CORE NEEDLE BIOPSY COMPARISON:  Previous exam(s). FINDINGS: The patient and I discussed the procedure of stereotactic-guided biopsy including benefits and alternatives. We discussed the high likelihood of a successful procedure. We discussed the risks of the procedure including infection, bleeding, tissue injury, clip migration, and inadequate sampling. Informed written consent was given. The usual time out protocol was performed immediately prior to the procedure. Using sterile technique and 1% Lidocaine as local anesthetic, under stereotactic guidance, a 9 gauge vacuum assisted device was used to perform core needle biopsy of calcifications in the anterior subareolar left breast using a cranial approach. Specimen radiograph was performed showing inclusion of calcifications of concern. Specimens with calcifications are identified for pathology. Lesion quadrant: Anterior subareolar left breast At the conclusion of the procedure, coil shaped tissue marker clip was deployed into the biopsy cavity. Follow-up 2-view mammogram was performed and dictated separately. IMPRESSION: Stereotactic-guided biopsy of left breast. No apparent complications. Electronically Signed: By: Sherian Rein M.D.  On: 06/27/2022 08:52  MM RT BREAST BX W LOC DEV 1ST LESION IMAGE BX SPEC STEREO GUIDE  Addendum Date: 06/28/2022   ADDENDUM REPORT: 06/28/2022 13:51 ADDENDUM: PATHOLOGY revealed: Site A. BREAST, LEFT, ANTERIOR; CORE BIOPSIES: - NO EVIDENCE OF SIGNIFICANT ATYPIA OR MALIGNANCY. - BENIGN BREAST PARENCHYMA WITH SCATTERED CALCIFICATIONS. Pathology results are CONCORDANT with imaging findings, per Dr. Sherian Rein. PATHOLOGY revealed: Site B. BREAST, RIGHT, LATERAL, MASS; CORE BIOPSIES: - NO EVIDENCE OF SIGNIFICANT ATYPIA OR MALIGNANCY. - GRANULOMATOUS INFLAMMATION PRESENT. Pathology results are CONCORDANT with imaging findings, per Dr. Sherian Rein. Pathology results and recommendations below were discussed with patient via [facility] Interpreter [phone] by telephone on 06/28/2022. Patient reported biopsy site within normal limits with slight tenderness at the site. Post biopsy care instructions were reviewed, questions were answered and my direct phone number was provided to patient. Patient was instructed to call [facility] if any concerns or questions arise related to the biopsy. RECOMMENDATION: Patient instructed to return in six months for unilateral RIGHT breast diagnostic mammogram to follow-up on RIGHT breast medial area of distortion per Dr. Mayford Knife. Patient informed a reminder notice will be sent regarding this appointment and she will need to call mammography site to schedule this appointment. Pathology results reported by Randa Lynn RN on 06/28/2022. Electronically Signed   By: Sherian Rein M.D.   On: 06/28/2022 13:51   Result Date: 06/28/2022 CLINICAL DATA:  Lateral right breast asymmetry for biopsy. EXAM: Right BREAST STEREOTACTIC CORE NEEDLE BIOPSY COMPARISON:  Previous exam(s). FINDINGS: The patient and I discussed the procedure of stereotactic-guided biopsy including benefits and alternatives. We discussed the high likelihood of a successful procedure. We discussed the risks of the  procedure including infection, bleeding, tissue injury, clip migration, and inadequate sampling. Informed written consent was given. The usual time out protocol was performed immediately prior to the procedure. Using sterile technique and 1% Lidocaine  as local anesthetic, under stereotactic guidance, a 9 gauge vacuum assisted device was used to perform core needle biopsy of asymmetry in the lateral right breast using a cranial approach. Specimen radiograph was performed. Lesion quadrant: Lateral right breast At the conclusion of the procedure, ribbon shaped tissue marker clip was deployed into the biopsy cavity. Follow-up 2-view mammogram was performed and dictated separately. IMPRESSION: Stereotactic-guided biopsy of right breast. No apparent complications. Electronically Signed: By: Sherian Rein M.D. On: 06/27/2022 09:07  MS CLIP PLACEMENT LEFT  Result Date: 06/27/2022 CLINICAL DATA:  Status stereotactic core biopsy left breast calcifications EXAM: DIAGNOSTIC LEFT MAMMOGRAM POST STEREOTACTIC BIOPSY COMPARISON:  Previous exam(s). FINDINGS: Mammographic images were obtained following stereotactic guided biopsy of anterior subareolar left breast calcifications. The biopsy marking clip is in expected position at the site of biopsy. IMPRESSION: Appropriate positioning of the coil shaped biopsy marking clip at the site of biopsy in the expected location of concern. Final Assessment: Post Procedure Mammograms for Marker Placement Electronically Signed   By: Sherian Rein M.D.   On: 06/27/2022 09:20  MS CLIP PLACEMENT RIGHT  Result Date: 06/27/2022 CLINICAL DATA:  Status post stereotactic core biopsy of right breast asymmetry EXAM: DIAGNOSTIC RIGHT MAMMOGRAM POST STEREOTACTIC BIOPSY COMPARISON:  Previous exam(s). FINDINGS: Mammographic images were obtained following stereotactic guided biopsy of asymmetry in the lateral right breast. The biopsy marking clip is in expected position at the site of biopsy. IMPRESSION:  Appropriate positioning of the ribbon shaped biopsy marking clip at the site of biopsy in the expected area of concern. Final Assessment: Post Procedure Mammograms for Marker Placement Electronically Signed   By: Sherian Rein M.D.   On: 06/27/2022 09:19  MS DIGITAL DIAG TOMO BILAT  Result Date: 06/12/2022 CLINICAL DATA:  Recent palpable lump on the right which has resolved. History of granulomatous mastitis. History of bilateral abscess ruptures with scarring. The scarring in the medial right breast correlates with the rupture from the ovary or 4 months ago. EXAM: DIGITAL DIAGNOSTIC BILATERAL MAMMOGRAM WITH TOMOSYNTHESIS AND CAD; ULTRASOUND RIGHT BREAST LIMITED TECHNIQUE: Bilateral digital diagnostic mammography and breast tomosynthesis was performed. The images were evaluated with computer-aided detection.; Targeted ultrasound examination of the right breast was performed COMPARISON:  Previous exam(s). ACR Breast Density Category c: The breast tissue is heterogeneously dense, which may obscure small masses. FINDINGS: There are new calcifications in the anterior left breast which appear to be new. These are best seen on the cc view. A few of these calcifications appear to be in a linear pattern. In total, the calcifications span 6 mm. There is a new mass in the lateral central right breast identified mammographically. This mass measures 5 mm. There is new distortion in the medial right breast with associated density. On spot imaging, there appears to be interspersed fat. This finding is suspected represent a mixture of fat necrosis and scarring. No other suspicious findings. On physical exam, the patient has scarring bilaterally. Targeted ultrasound is performed, showing a complicated pattern consistent with history of granulomatous mastitis and previous abscesses with scarring. There is distortion and an associated mass at 2 o'clock, 3 cm from the nipple measuring 14 x 3 x 11 mm, likely correlating with the  suspected scarring identified mammographically. This clearly connects to the scarring on the skin with real-time imaging. No sonographic correlate is identified for the lateral mass. IMPRESSION: 1. The distortion in the medial right breast is favored to represent scarring and connects to the scarring in the skin sonographically. 2. There  is a new mass in the lateral central right breast without sonographic correlate. 3. New calcifications anteriorly in the left breast. RECOMMENDATION: Recommend stereotactic biopsy of the lateral central right breast mass and the left breast calcifications. If these biopsies are benign, recommend 3 month follow-up mammography and ultrasound of the probable scarring in the medial right breast. I have discussed the findings and recommendations with the patient. If applicable, a reminder letter will be sent to the patient regarding the next appointment. BI-RADS CATEGORY  4: Suspicious. Electronically Signed   By: Gerome Sam III M.D.   On: 06/12/2022 15:03  MM CLIP PLACEMENT RIGHT  Result Date: 03/13/2021 CLINICAL DATA:  Evaluate post biopsy marker clip placement following ultrasound-guided core needle biopsy of the right breast. EXAM: DIAGNOSTIC RIGHT MAMMOGRAM POST ULTRASOUND BIOPSY COMPARISON:  Previous exam(s). FINDINGS: Mammographic images were obtained following ultrasound guided biopsy of a retroareolar right breast mass. The biopsy marking clip is in expected position at the site of biopsy. IMPRESSION: Appropriate positioning of the vision shaped biopsy marking clip at the site of biopsy in the 12 o'clock retroareolar right breast. Final Assessment: Post Procedure Mammograms for Marker Placement Electronically Signed   By: Amie Portland M.D.   On: 03/13/2021 09:41   MM DIAG BREAST TOMO BILATERAL  Result Date: 03/03/2021 CLINICAL DATA:  Here for evaluation of a palpable area of concern felt by the patient in the right breast, right nipple pain, and firmness in the  lateral aspect of the left breast in the location the patient's prior abscess. EXAM: DIGITAL DIAGNOSTIC BILATERAL MAMMOGRAM WITH TOMOSYNTHESIS AND CAD; ULTRASOUND RIGHT BREAST LIMITED; ULTRASOUND LEFT BREAST LIMITED TECHNIQUE: Bilateral digital diagnostic mammography and breast tomosynthesis was performed. The images were evaluated with computer-aided detection.; Targeted ultrasound examination of the right breast was performed; Targeted ultrasound examination of the left breast was performed COMPARISON:  Previous exam(s). ACR Breast Density Category b: There are scattered areas of fibroglandular density. FINDINGS: Additional views including spot compression demonstrate an oval 10 mm mass in the retroareolar right breast. No suspicious mass, microcalcification, or other finding is identified in the left breast. Targeted right breast ultrasound was performed in the palpable area of concern and in the area of pain in the retroareolar right breast. In the retroareolar right breast at 12 o'clock an oval hypoechoic mass measures 10 x 5 x 7 mm. This corresponds to the palpable area felt by the patient. It is unclear if this corresponds to the area of pain. No abnormally enlarged right axillary lymph node is identified. Targeted left breast ultrasound was performed by the sonographer and the physician in the palpable area of concern from the 3 o'clock to the 5 o'clock position. Mild skin thickening and hyperpigmentation likely represents scarring from the patient's prior abscess in this location. No suspicious solid or cystic mass is identified. IMPRESSION: 1. Palpable hypoechoic mass in the retroareolar right breast is suspicious for malignancy. It is unclear if this explains the patient's right nipple pain. 2. Mild skin thickening and hyperpigmentation likely represents scarring from the patient's prior abscess. No evidence of malignancy in the left breast. RECOMMENDATION: Recommend ultrasound-guided right breast biopsy.  I have discussed the findings and recommendations with the patient. If applicable, a reminder letter will be sent to the patient regarding the next appointment. BI-RADS CATEGORY  4: Suspicious. Electronically Signed   By: Romona Curls M.D.   On: 03/03/2021 16:16   MM DIAG BREAST TOMO BILATERAL  Result Date: 12/21/2019 CLINICAL DATA:  42 year old presenting  for follow-up of an intradermal abscess involving the LOWER OUTER periareolar LEFT breast for which the patient has been previously treated with antibiotic therapy. She was seen at [facility] in July, 2020, and had a LEFT breast ultrasound demonstrating cellulitis and abscess within the skin. She presents today with recurrent LOWER OUTER LEFT breast pain and a possible palpable abnormality in the UPPER OUTER LEFT areola. Annual evaluation, RIGHT breast. EXAM: DIGITAL DIAGNOSTIC BILATERAL MAMMOGRAM WITH CAD AND TOMO ULTRASOUND LEFT BREAST COMPARISON:  Previous exam(s). ACR Breast Density Category b: There are scattered areas of fibroglandular density. FINDINGS: Tomosynthesis and synthesized full field CC and MLO views of both breasts were obtained. Tomosynthesis and synthesized spot compression tangential view of the area of concern in the LEFT breast was also obtained. No mammographic abnormality in the LOWER OUTER periareolar LEFT breast in the area of recurrent pain. No findings suspicious for malignancy in either breast. Mammographic images were processed with CAD. On correlative physical exam, there is discoloration involving the skin of the OUTER LEFT breast, but there is no underlying palpable mass, and the skin is not erythematous. There is a palpable ridge superficially in the UPPER OUTER portion of the areola corresponding with what the patient is feeling. Targeted LEFT breast ultrasound is performed, showing the skin abscess described on the [facility] ultrasound in July, 2020 is barely perceptible currently. Corresponding to the palpable concern at the  1 o'clock position 1 cm from the nipple is a fluid collection in the skin measuring 0.2 x 0.9 x 1.1 cm, demonstrating posterior acoustic enhancement and no internal color Doppler flow, without evidence of adjacent hyperemia. The underlying breast tissues throughout the OUTER breast are normal in appearance. IMPRESSION: 1. Benign skin lesion involving the UPPER OUTER portion of the LEFT areola corresponding to the patient's current palpable concern. This may represent a sebaceous cyst or a very small skin abscess. 2. The skin abscess described on the report of the [facility] ultrasound in July, 2020, located in the OUTER LEFT breast at the 3 o'clock position approximately 2 cm from the nipple is barely perceptible currently. The skin in this location remains discolored. 3. No mammographic or sonographic evidence of malignancy involving the LEFT breast. 4. No mammographic evidence of malignancy involving the RIGHT breast. RECOMMENDATION: Screening mammogram in one year.(Code:[id number]-B-01Y) I have discussed the findings and recommendations with the patient. If applicable, a reminder letter will be sent to the patient regarding the next appointment. BI-RADS CATEGORY  2: Benign. Electronically Signed   By: Hulan Saas M.D.   On: 12/21/2019 14:52   MM DIAG BREAST TOMO BILATERAL  Result Date: 01/09/2019 CLINICAL DATA:  Patient complains of a palpable mass and pain in the 4 o'clock region of the left breast. EXAM: DIGITAL DIAGNOSTIC BILATERAL MAMMOGRAM WITH CAD AND TOMO ULTRASOUND LEFT BREAST COMPARISON:  None. ACR Breast Density Category c: The breast tissue is heterogeneously dense, which may obscure small masses. FINDINGS: No suspicious mass, malignant type microcalcifications or distortion detected in either breast. Spot tangential view of the area of clinical concern in the left breast shows normal fibroglandular tissue. Mammographic images were processed with CAD. On physical exam, no mass palpated in the area of  clinical concern in the left breast at 3-4 o'clock 2 cm from the nipple. Targeted ultrasound is performed, showing normal tissue in the area of clinical concern in the 3-4 o'clock region of the left breast. No solid or cystic mass, abnormal shadowing or distortion visualized. IMPRESSION: No evidence of malignancy in  either breast. RECOMMENDATION: If the clinical exam remains benign/stable screening mammography can be deferred until the age of 70. I have discussed the findings and recommendations with the patient. Results were also provided in writing at the conclusion of the visit. If applicable, a reminder letter will be sent to the patient regarding the next appointment. BI-RADS CATEGORY  1: Negative. Electronically Signed   By: Baird Lyons M.D.   On: 01/09/2019 15:00        Pelvic/Bimanual Ext Genitalia No lesions, no swelling and no discharge observed on external genitalia.        Vagina Vagina pink and normal texture. No lesions or discharge observed in vagina.        Cervix Cervix is present. Cervix pink and of normal texture. No discharge observed.    Uterus Uterus is present and palpable. Uterus in normal position and normal size.        Adnexae Bilateral ovaries present and palpable. No tenderness on palpation.         Rectovaginal No rectal exam completed today since patient had no rectal complaints. No skin abnormalities observed on exam.     Smoking History: Patient has never smoked and was not referred to quit line.    Patient Navigation: Patient education provided. Access to services provided for patient through B[facility]CP program. Delos Haring interpreter provided. No transportation provided   Colorectal Cancer Screening: Per patient has never had colonoscopy completed No complaints today.    Breast and Cervical Cancer Risk Assessment: Patient has family history of breast cancer with Paternal Aunt. Patient does not have history of cervical dysplasia, immunocompromised, or DES  exposure in-utero.  Risk Scores as of Encounter on 06/17/2023     Dondra Spry           5-year 1.39%   Lifetime 12.54%   This patient is [place] but has no documented birth country, so the [place] model used data from [place] patients to calculate their risk score. Document a birth country in the Demographics activity for a more accurate score.         Last calculated by Narda Rutherford, LPN on 2/95/2841 at 11:06 AM        A: BCCCP exam with pap smear No complaints with benign exam.   P: Referred patient to the [facility] for a screening mammogram. Appointment scheduled 06/17/2023.  Pascal Lux, NP 06/17/2023 11:17 AM

## 2023-06-17 NOTE — Patient Instructions (Signed)
Taught Andrew Au about self breast awareness and gave educational materials to take home. Patient did need a Pap smear today due to last Pap smear was in 06/05/22 per patient.  Let her know B[facility]CP will cover Pap smears every 5 years unless has a history of abnormal Pap smears. Referred [facility] to [facility] for diagnostic mammogram. Appointment scheduled for 06/17/2023. Patient aware of appointment and will be there. Let patient know will follow up with her within the next couple weeks with results. Andrew Au verbalized understanding.  Pascal Lux, NP 11:00 AM

## 2023-06-21 ENCOUNTER — Telehealth: Payer: Self-pay

## 2023-06-21 NOTE — Telephone Encounter (Signed)
Via Rito Ehrlich, [facility] Spanish Interpreter, Patient informed negative Pap/HPV results, due to previous abnormal results, needs to repeat pap in 1 year. Patient verbalized understanding.

## 2024-08-24 ENCOUNTER — Other Ambulatory Visit: Payer: Self-pay

## 2024-08-24 DIAGNOSIS — N644 Mastodynia: Secondary | ICD-10-CM

## 2024-09-05 NOTE — Progress Notes (Unsigned)
 Ms. Monica Travis is a 43 y.o. female who presents to [facility] clinic today with {Blank single:19197::no complaints,complaint of} ***.    Pap Smear: Pap not smear completed today. Last Pap smear was *** at *** clinic and was {Blank single:19197::normal,abnormal - ***}. Per patient has {Blank single:19197::no history,history} of an abnormal Pap smear. Last Pap smear result {Blank single:19197::is available in,is not available in} Epic.   Physical exam: Breasts Breasts symmetrical. No skin abnormalities bilateral breasts. No nipple retraction bilateral breasts. No nipple discharge bilateral breasts. No lymphadenopathy. No lumps palpated bilateral breasts.       Pelvic/Bimanual Pap is not indicated today    Smoking History: Patient has {Blank single:19197::never smoked,is a former smoker,is a current smoker at *** packs per day} ***referred to quit line.    Patient Navigation: Patient education provided. Access to services provided for patient through [facility] program. *** interpreter provided. *** transportation provided   Colorectal Cancer Screening: Per patient {Blank single:19197::has had colonoscopy completed on ***,has never had colonoscopy completed} No complaints today.    Breast and Cervical Cancer Risk Assessment: Patient {Blank single:19197::has,does not have} family history of breast cancer, known genetic mutations, or radiation treatment to the chest before age 84. Patient {Blank single:19197::has,does not have} history of cervical dysplasia, immunocompromised, or DES exposure in-utero.  Risk Scores as of Encounter on 09/07/2024     Monica Travis as of 06/17/2023           5-year 1.39%   Lifetime 12.54%   This patient is [place]/Latina but has no documented birth country, so the [place] model used data from [place] patients to calculate their risk score. Document a birth country in the Demographics activity for a more accurate score.         Last  calculated by Rogerio Tempie SQUIBB, LPN on 2/70/7975 at 11:06 AM        A: B[facility]CP exam without pap smear Complaint of ***  P: Referred patient to [facility] for a {Blank single:19197::screening,diagnostic} mammogram. Appointment scheduled ***.  Geofm Delon BRAVO, NP 09/05/2024 8:27 PM

## 2024-09-07 ENCOUNTER — Ambulatory Visit: Payer: Self-pay | Attending: Obstetrics and Gynecology | Admitting: *Deleted

## 2024-09-07 ENCOUNTER — Ambulatory Visit
Admission: RE | Admit: 2024-09-07 | Discharge: 2024-09-07 | Disposition: A | Discharge status: Home / Self Care | Payer: Self-pay | Source: Ambulatory Visit | Attending: Obstetrics and Gynecology | Admitting: Obstetrics and Gynecology

## 2024-09-07 ENCOUNTER — Other Ambulatory Visit: Payer: Self-pay

## 2024-09-07 ENCOUNTER — Inpatient Hospital Stay
Admission: RE | Admit: 2024-09-07 | Discharge: 2024-09-07 | Payer: Self-pay | Attending: Obstetrics and Gynecology | Admitting: Obstetrics and Gynecology

## 2024-09-07 VITALS — BP 129/77 | Ht <= 58 in | Wt 165.2 lb

## 2024-09-07 DIAGNOSIS — Z1231 Encounter for screening mammogram for malignant neoplasm of breast: Secondary | ICD-10-CM

## 2024-09-07 DIAGNOSIS — O24419 Gestational diabetes mellitus in pregnancy, unspecified control: Secondary | ICD-10-CM

## 2024-09-07 DIAGNOSIS — Z124 Encounter for screening for malignant neoplasm of cervix: Secondary | ICD-10-CM

## 2024-09-07 DIAGNOSIS — N644 Mastodynia: Secondary | ICD-10-CM

## 2024-09-15 LAB — CERVICOVAGINAL ANCILLARY ONLY
Bacterial Vaginitis (gardnerella): NEGATIVE
Candida Glabrata: NEGATIVE
Candida Vaginitis: NEGATIVE
Comment: NEGATIVE
Comment: NEGATIVE
Comment: NEGATIVE
Comment: NEGATIVE
Trichomonas: NEGATIVE

## 2024-09-15 LAB — CYTOLOGY - PAP
Comment: NEGATIVE
Comment: NEGATIVE
Comment: NEGATIVE
Diagnosis: NEGATIVE
HPV 16: NEGATIVE
HPV 18 / 45: NEGATIVE
High risk HPV: POSITIVE — AB

## 2024-09-16 ENCOUNTER — Ambulatory Visit: Payer: Self-pay | Admitting: *Deleted

## 2024-09-16 NOTE — Telephone Encounter (Addendum)
 Via, Reine Norlander, [facility] Spanish Interpreter, Patient informed pap results  Negative with Positive HPV, needs tor repeat pap smear in 1 year. Discussed HPV with patient, immune support, avoid smoking. Patient verbalized understanding.   ----- Message from Nurse Wanda B sent at 09/16/2024 12:20 PM EDT ----- Pap smear due in 1 year per [facility]CCP guidelines. ----- Message ----- From: Interface, Lab In Three Zero One Sent: 09/15/2024   2:28 PM EDT To: [employer]

## 2024-09-16 NOTE — Progress Notes (Signed)
 Pap smear due in 1 year per ASCCP guidelines.
